# Patient Record
Sex: Female | Born: 1940 | Race: Black or African American | Hispanic: No | Marital: Married | Smoking: Never smoker
Health system: Southern US, Community
[De-identification: ages and names within clinical notes are randomized; demographics above are authoritative.]

## PROBLEM LIST (undated history)

## (undated) DIAGNOSIS — I10 Essential (primary) hypertension: Secondary | ICD-10-CM

## (undated) DIAGNOSIS — M1990 Unspecified osteoarthritis, unspecified site: Secondary | ICD-10-CM

## (undated) DIAGNOSIS — E119 Type 2 diabetes mellitus without complications: Secondary | ICD-10-CM

## (undated) HISTORY — PX: EYE SURGERY: SHX253

---

## 2016-11-23 DIAGNOSIS — I10 Essential (primary) hypertension: Secondary | ICD-10-CM | POA: Insufficient documentation

## 2016-11-23 DIAGNOSIS — E119 Type 2 diabetes mellitus without complications: Secondary | ICD-10-CM | POA: Insufficient documentation

## 2018-05-25 ENCOUNTER — Emergency Department (HOSPITAL_BASED_OUTPATIENT_CLINIC_OR_DEPARTMENT_OTHER)
Admission: EM | Admit: 2018-05-25 | Discharge: 2018-05-25 | Disposition: A | Discharge status: Home / Self Care | Payer: Medicare HMO | Attending: Emergency Medicine | Admitting: Emergency Medicine

## 2018-05-25 ENCOUNTER — Encounter (HOSPITAL_BASED_OUTPATIENT_CLINIC_OR_DEPARTMENT_OTHER): Payer: Self-pay | Admitting: Emergency Medicine

## 2018-05-25 ENCOUNTER — Other Ambulatory Visit: Payer: Self-pay

## 2018-05-25 DIAGNOSIS — I10 Essential (primary) hypertension: Secondary | ICD-10-CM | POA: Insufficient documentation

## 2018-05-25 DIAGNOSIS — R1031 Right lower quadrant pain: Secondary | ICD-10-CM | POA: Diagnosis present

## 2018-05-25 DIAGNOSIS — E119 Type 2 diabetes mellitus without complications: Secondary | ICD-10-CM | POA: Insufficient documentation

## 2018-05-25 DIAGNOSIS — Z79899 Other long term (current) drug therapy: Secondary | ICD-10-CM | POA: Insufficient documentation

## 2018-05-25 DIAGNOSIS — M5430 Sciatica, unspecified side: Secondary | ICD-10-CM

## 2018-05-25 HISTORY — DX: Essential (primary) hypertension: I10

## 2018-05-25 HISTORY — DX: Type 2 diabetes mellitus without complications: E11.9

## 2018-05-25 MED ORDER — MORPHINE SULFATE (PF) 4 MG/ML IV SOLN
4.0000 mg | Freq: Once | INTRAVENOUS | Status: AC
  Administered 2018-05-25: 4 mg via INTRAMUSCULAR
  Filled 2018-05-25: qty 1

## 2018-05-25 MED ORDER — PREDNISONE 10 MG (21) PO TBPK: ORAL_TABLET | ORAL | 0 refills | Status: DC

## 2018-05-25 MED ORDER — KETOROLAC TROMETHAMINE 30 MG/ML IJ SOLN
15.0000 mg | Freq: Once | INTRAMUSCULAR | Status: AC
  Administered 2018-05-25: 15 mg via INTRAVENOUS
  Filled 2018-05-25: qty 1

## 2018-05-25 MED ORDER — ONDANSETRON 4 MG PO TBDP
4.0000 mg | ORAL_TABLET | Freq: Once | ORAL | Status: AC
  Administered 2018-05-25: 4 mg via ORAL
  Filled 2018-05-25: qty 1

## 2018-05-25 MED ORDER — MORPHINE SULFATE (PF) 4 MG/ML IV SOLN: 4.0000 mg | Freq: Once | INTRAVENOUS | Status: DC

## 2018-05-25 NOTE — ED Triage Notes (Signed)
Patient states that she started to have pain to her right leg and hip with pain shooting down her right leg  - went to an urgent care yesterday and was treated  - called back today to them and they reported that the patient would have to follow up somewhere else

## 2018-05-25 NOTE — ED Provider Notes (Signed)
[facility] EMERGENCY DEPARTMENT Provider Note   CSN: [id number] Arrival date & time: 05/25/18  1431     History   Chief Complaint Chief Complaint  Patient presents with  . Back Pain    HPI Stacey Sloan is a 77 y.o. female.  Pt presents to the ED today with right sided back with pain shooting down right leg.  Sx started Thursday, 8/8.  The pt is normally very active and so she's not sure what she did.  She has never had anything like this in the past.  The pt said she went to urgent care yesterday and was given a shot of decadron.  She was also given a rx of meloxicam.  Urgent care is setting her up to see ortho and get an MRI.  Pain is worse today, so she came in here.     Past Medical History:  Diagnosis Date  . Diabetes mellitus without complication (HCC)   . Hypertension     There are no active problems to display for this patient.   History reviewed. No pertinent surgical history.   OB History   None      Home Medications    Prior to Admission medications   Medication Sig Start Date End Date Taking? Authorizing Provider  amLODipine (NORVASC) 10 MG tablet amlodipine 10 mg tablet    [provider]  glipiZIDE-metformin (METAGLIP) 5-500 MG tablet glipizide 5 mg-metformin 500 mg tablet    [provider]  lisinopril-hydrochlorothiazide (PRINZIDE,ZESTORETIC) 20-25 MG tablet lisinopril 20 mg-hydrochlorothiazide 25 mg tablet    [provider]  predniSONE (STERAPRED UNI-PAK 21 TAB) 10 MG (21) TBPK tablet Take 6 tabs by mouth daily  for 2 days, then 5 tabs for 2 days, then 4 tabs for 2 days, then 3 tabs for 2 days, 2 tabs for 2 days, then 1 tab by mouth daily for 2 days 05/25/18   Jacalyn LefevreHaviland, Breckon Reeves, MD    Family History History reviewed. No pertinent family history.  Social History Social History   Tobacco Use  . Smoking status: Never Smoker  . Smokeless tobacco: Never Used  Substance Use Topics  . Alcohol use: Never   Frequency: Never  . Drug use: Never     Allergies   Patient has no known allergies.   Review of Systems Review of Systems  Musculoskeletal:       Right leg pain  All other systems reviewed and are negative.    Physical Exam Updated Vital Signs BP 131/75 (BP Location: Right Arm)   Pulse 68   Temp 99 F (37.2 C) (Oral)   Resp 16   Ht 5\' 3"  (1.6 m)   Wt 56.7 kg   SpO2 100%   BMI 22.14 kg/m   Physical Exam  Constitutional: She is oriented to person, place, and time. She appears well-developed and well-nourished.  HENT:  Head: Normocephalic and atraumatic.  Right Ear: External ear normal.  Left Ear: External ear normal.  Nose: Nose normal.  Mouth/Throat: Oropharynx is clear and moist.  Eyes: Pupils are equal, round, and reactive to light. Conjunctivae and EOM are normal.  Neck: Normal range of motion. Neck supple.  Cardiovascular: Normal rate, regular rhythm, normal heart sounds and intact distal pulses.  Pulmonary/Chest: Effort normal and breath sounds normal.  Abdominal: Soft. Bowel sounds are normal.  Musculoskeletal: Normal range of motion.  Neurological: She is alert and oriented to person, place, and time.  Skin: Skin is warm. Capillary refill takes less than  2 seconds.  Psychiatric: She has a normal mood and affect. Her behavior is normal. Judgment and thought content normal.  Nursing note and vitals reviewed.    ED Treatments / Results  Labs (all labs ordered are listed, but only abnormal results are displayed) Labs Reviewed - No data to display  EKG None  Radiology No results found.  Procedures Procedures (including critical care time)  Medications Ordered in ED Medications  morphine 4 MG/ML injection 4 mg (has no administration in time range)  morphine 4 MG/ML injection 4 mg (4 mg Intramuscular Given 05/25/18 1521)  ondansetron (ZOFRAN-ODT) disintegrating tablet 4 mg (4 mg Oral Given 05/25/18 1522)  ketorolac (TORADOL) 30 MG/ML injection 15 mg  (15 mg Intravenous Given 05/25/18 1630)     Initial Impression / Assessment and Plan / ED Course  I have reviewed the triage vital signs and the nursing notes.  Pertinent labs & imaging results that were available during my care of the patient were reviewed by me and considered in my medical decision making (see chart for details).    Pt had a CD of lumbar spine films from urgent care.  She has severe ddd and mild scoliosis.  She has little disc space.  Pt has a cane at home, but I think she will need a walker. I wrote a rx for one.  She is able to ambulate, but requires assistance.  Family is close by and can help pt. She does have a rx for lortab which the urgent care gave her today.   Final Clinical Impressions(s) / ED Diagnoses   Final diagnoses:  Acute sciatica    ED Discharge Orders         Ordered    For home use only DME 4 wheeled rolling walker with seat     05/25/18 1704    predniSONE (STERAPRED UNI-PAK 21 TAB) 10 MG (21) TBPK tablet     05/25/18 1705           Jacalyn Lefevre, MD 05/25/18 [phone]

## 2018-05-25 NOTE — ED Notes (Signed)
Pt able to bear weight on leg, but grabbing onto staff and family for support while limping. Son states she usually walks unassisted and with no limp. EDP informed and now at bedside.

## 2018-05-27 ENCOUNTER — Ambulatory Visit (HOSPITAL_BASED_OUTPATIENT_CLINIC_OR_DEPARTMENT_OTHER)
Admission: RE | Admit: 2018-05-27 | Discharge: 2018-05-27 | Disposition: A | Discharge status: Home / Self Care | Payer: Medicare HMO | Source: Ambulatory Visit | Attending: Family Medicine | Admitting: Family Medicine

## 2018-05-27 ENCOUNTER — Ambulatory Visit (INDEPENDENT_AMBULATORY_CARE_PROVIDER_SITE_OTHER): Payer: Medicare HMO | Admitting: Family Medicine

## 2018-05-27 ENCOUNTER — Encounter: Payer: Self-pay | Admitting: Family Medicine

## 2018-05-27 VITALS — BP 161/78 | HR 93 | Ht 63.0 in | Wt 125.0 lb

## 2018-05-27 DIAGNOSIS — M1611 Unilateral primary osteoarthritis, right hip: Secondary | ICD-10-CM | POA: Insufficient documentation

## 2018-05-27 DIAGNOSIS — M79604 Pain in right leg: Secondary | ICD-10-CM | POA: Diagnosis not present

## 2018-05-27 DIAGNOSIS — M25551 Pain in right hip: Secondary | ICD-10-CM

## 2018-05-27 NOTE — Patient Instructions (Addendum)
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 Finish your prednisone dose pack. Don't take meloxicam while you're on this. Topical capsaicin, aspercreme or biofreeze up to 4 times a day topically. Ok to use salon pas patches with this also. Hydrocodone as needed for severe pain - cut pills in half to start with though. Use walker when up and walking around for support. Consider physical therapy - we can order this for you (there's one next door) - just let us know. Follow up in 1 week. If not improving we can consider trial of hip injection.

## 2018-05-27 NOTE — Progress Notes (Signed)
PCP: System, Pcp Not In  Subjective:   HPI: Patient is a 77 y.o. female here for right-sided posterior hip pain with radiation to the right foot.  Onset was 4 days ago.  Patient denies any known mechanism of injury.  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  She reports initial 10/10 pain is now 8/10 which is worse with walking wearing weight on that leg.  Relieved with rest.  3 days ago she was seen at the urgent care where lumbar x-rays were performed she was given an IM steroid injection.  The following day she went to the emergency department was treated with morphine, Toradol and discharged on a prednisone taper.  Her pain radiates down the posterior aspect of the right leg.  She denies any numbness or tingling associated with this.  No skin changes.  Past Medical History:  Diagnosis Date  . Diabetes mellitus without complication (HCC)   . Hypertension     Current Outpatient Medications on File Prior to Visit  Medication Sig Dispense Refill  . amLODipine (NORVASC) 10 MG tablet amlodipine 10 mg tablet    . glipiZIDE-metformin (METAGLIP) 5-500 MG tablet glipizide 5 mg-metformin 500 mg tablet    . lisinopril-hydrochlorothiazide (PRINZIDE,ZESTORETIC) 20-25 MG tablet lisinopril 20 mg-hydrochlorothiazide 25 mg tablet    . predniSONE (STERAPRED UNI-PAK 21 TAB) 10 MG (21) TBPK tablet Take 6 tabs by mouth daily  for 2 days, then 5 tabs for 2 days, then 4 tabs for 2 days, then 3 tabs for 2 days, 2 tabs for 2 days, then 1 tab by mouth daily for 2 days 42 tablet 0   No current facility-administered medications on file prior to visit.     History reviewed. No pertinent surgical history.  No Known Allergies  Social History   Socioeconomic History  . Marital status: Married    Spouse name: Not on file  . Number of children: Not on file  . Years of education: Not on file  . Highest education level: Not on file  Occupational History  . Not on file  Social Needs  . Financial  resource strain: Not on file  . Food insecurity:    Worry: Not on file    Inability: Not on file  . Transportation needs:    Medical: Not on file    Non-medical: Not on file  Tobacco Use  . Smoking status: Never Smoker  . Smokeless tobacco: Never Used  Substance and Sexual Activity  . Alcohol use: Never    Frequency: Never  . Drug use: Never  . Sexual activity: Not on file  Lifestyle  . Physical activity:    Days per week: Not on file    Minutes per session: Not on file  . Stress: Not on file  Relationships  . Social connections:    Talks on phone: Not on file    Gets together: Not on file    Attends religious service: Not on file    Active member of club or organization: Not on file    Attends meetings of clubs or organizations: Not on file    Relationship status: Not on file  . Intimate partner violence:    Fear of current or ex partner: Not on file    Emotionally abused: Not on file    Physically abused: Not on file    Forced sexual activity: Not on file  Other Topics Concern  . Not on file  Social History Narrative  . Not on file  History reviewed. No pertinent family history.  BP (!) 161/78   Pulse 93   Ht 5\' 3"  (1.6 m)   Wt 125 lb (56.7 kg)   BMI 22.14 kg/m   Review of Systems: See HPI above.     Objective:  Physical Exam:  Gen: awake, alert, NAD, comfortable in exam room Pulm: breathing unlabored  Lumbar spine: - Inspection: no gross deformity or asymmetry, swelling or ecchymosis - Palpation: No TTP over the spinous processes, paraspinal muscles - ROM: Limited range of motion flexion and extension - Strength: 4+/5 strength in knee flexion on the right.  Otherwise normal and symmetric strength of the lower extremities - Neuro: Normal sensation to light touch bilaterally - Special testing: Negative straight leg raise  Right hip:  - Inspection: No gross deformity - Palpation: No TTP - ROM: Decreased hip flexion but symmetric bilaterally.   Significant decrease in internal rotation, patient also has limited external rotation - Special Tests: Limited range of motion with FABER and FADIR.    Left hip:  - Inspection: No gross deformity - Palpation: No TTP - ROM: Decreased hip flexion as noted above.  Some decreased internal rotation but less severe than the right - Special Tests: Negative FABER and FADIR    Assessment & Plan:  1.  Right-sided sciatica: Patient has sciatica on the right with likely compression at the level of the hip.  X-rays obtained in urgent care were independently reviewed which show significant lumbar DDD which is most severe at L2-3 and L4-5.  Patient was sent for x-rays of the hip today which show no evidence of fracture but severe arthritis of the right hip, very mild of left hip.  Her exam still suggests sciatica as cause of her pain despite her hip arthritis. - Continue steroid Dosepak - Continue to walker as needed for ambulation - We will refer to physical therapy - Follow up in 1 week.

## 2018-05-28 ENCOUNTER — Encounter: Payer: Self-pay | Admitting: Family Medicine

## 2018-05-31 ENCOUNTER — Encounter: Payer: Self-pay | Admitting: Family Medicine

## 2018-05-31 ENCOUNTER — Ambulatory Visit: Payer: Medicare HMO | Admitting: Family Medicine

## 2018-05-31 VITALS — BP 160/81 | HR 85 | Ht 63.0 in | Wt 125.0 lb

## 2018-05-31 DIAGNOSIS — M25551 Pain in right hip: Secondary | ICD-10-CM | POA: Diagnosis not present

## 2018-05-31 DIAGNOSIS — M5441 Lumbago with sciatica, right side: Secondary | ICD-10-CM | POA: Diagnosis not present

## 2018-05-31 MED ORDER — METHYLPREDNISOLONE ACETATE 40 MG/ML IJ SUSP
40.0000 mg | Freq: Once | INTRAMUSCULAR | Status: AC
  Administered 2018-05-31: 40 mg via INTRA_ARTICULAR

## 2018-05-31 MED ORDER — DICLOFENAC SODIUM 1 % TD GEL: 4.0000 g | Freq: Four times a day (QID) | TRANSDERMAL | 1 refills | Status: DC

## 2018-05-31 MED ORDER — ONDANSETRON HCL 4 MG PO TABS: 4.0000 mg | ORAL_TABLET | Freq: Four times a day (QID) | ORAL | 0 refills | Status: AC | PRN

## 2018-05-31 NOTE — Patient Instructions (Signed)
You were given an intraarticular hip injection today. We will also set up an MRI of your lumbar spine.

## 2018-06-03 ENCOUNTER — Ambulatory Visit: Payer: Medicare HMO | Admitting: Family Medicine

## 2018-06-03 ENCOUNTER — Encounter: Payer: Self-pay | Admitting: Family Medicine

## 2018-06-03 NOTE — Progress Notes (Addendum)
PCP: System, Pcp Not In  Subjective:   HPI: 8/12: Patient is a 77 y.o. female here for right-sided posterior hip pain with radiation to the right foot.  Onset was 4 days ago.  Patient denies any known mechanism of injury.  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  She reports initial 10/10 pain is now 8/10 which is worse with walking wearing weight on that leg.  Relieved with rest.  3 days ago she was seen at the urgent care where lumbar x-rays were performed she was given an IM steroid injection.  The following day she went to the emergency department was treated with morphine, Toradol and discharged on a prednisone taper.  Her pain radiates down the posterior aspect of the right leg.  She denies any numbness or tingling associated with this.  No skin changes.  8/16: Patient returns today with severe pain in right buttock radiating down to her right foot with numbness. Pain level 7/10 and sharp, worse trying to bear weight. Prednisone did not seem to help - same for toradol, morphine, IM steroid. No skin changes.  Past Medical History:  Diagnosis Date  . Diabetes mellitus without complication (HCC)   . Hypertension     Current Outpatient Medications on File Prior to Visit  Medication Sig Dispense Refill  . amLODipine (NORVASC) 10 MG tablet amlodipine 10 mg tablet    . glipiZIDE-metformin (METAGLIP) 5-500 MG tablet glipizide 5 mg-metformin 500 mg tablet    . HYDROcodone-acetaminophen (NORCO/VICODIN) 5-325 MG tablet Take 1 tablet by mouth every 6 (six) hours as needed.  0  . lisinopril-hydrochlorothiazide (PRINZIDE,ZESTORETIC) 20-25 MG tablet lisinopril 20 mg-hydrochlorothiazide 25 mg tablet    . meloxicam (MOBIC) 15 MG tablet Take 15 mg by mouth daily as needed. for pain  0  . predniSONE (STERAPRED UNI-PAK 21 TAB) 10 MG (21) TBPK tablet Take 6 tabs by mouth daily  for 2 days, then 5 tabs for 2 days, then 4 tabs for 2 days, then 3 tabs for 2 days, 2 tabs for 2 days, then 1 tab by  mouth daily for 2 days 42 tablet 0   No current facility-administered medications on file prior to visit.     History reviewed. No pertinent surgical history.  No Known Allergies  Social History   Socioeconomic History  . Marital status: Married    Spouse name: Not on file  . Number of children: Not on file  . Years of education: Not on file  . Highest education level: Not on file  Occupational History  . Not on file  Social Needs  . Financial resource strain: Not on file  . Food insecurity:    Worry: Not on file    Inability: Not on file  . Transportation needs:    Medical: Not on file    Non-medical: Not on file  Tobacco Use  . Smoking status: Never Smoker  . Smokeless tobacco: Never Used  Substance and Sexual Activity  . Alcohol use: Never    Frequency: Never  . Drug use: Never  . Sexual activity: Not on file  Lifestyle  . Physical activity:    Days per week: Not on file    Minutes per session: Not on file  . Stress: Not on file  Relationships  . Social connections:    Talks on phone: Not on file    Gets together: Not on file    Attends religious service: Not on file    Active member of club  or organization: Not on file    Attends meetings of clubs or organizations: Not on file    Relationship status: Not on file  . Intimate partner violence:    Fear of current or ex partner: Not on file    Emotionally abused: Not on file    Physically abused: Not on file    Forced sexual activity: Not on file  Other Topics Concern  . Not on file  Social History Narrative  . Not on file    History reviewed. No pertinent family history.  BP (!) 160/81   Pulse 85   Ht 5\' 3"  (1.6 m)   Wt 125 lb (56.7 kg)   BMI 22.14 kg/m   Review of Systems: See HPI above.     Objective:  Physical Exam:  Gen: NAD, seated in wheelchair  Back: No gross deformity, scoliosis. No paraspinal TTP .  No midline or bony TTP. Limited flexion and extension due to pain. Strength LEs  4/5 right hip flexion and knee extension.  5/5 all other muscle groups.   Negative SLRs. Sensation intact to light touch bilaterally.  Right hip: No deformity. Minimal motion, severely limited with very mild pain. No tenderness to palpation. Negative logroll. Negative fabers.   Assessment & Plan:  1.  Right leg pain - pain currently out of proportion to exam.  Her radiographs of lumbar spine and hip negative for acute findings, malignancy but does have severe arthritis at L2-3 and L4-5 of back as well as right hip.  Not improving with prednisone dose pack, walker.  Trial of intraarticular hip injection today with ultrasound guidance.  Will also set up for MRI of lumbar spine - still suspect problem primarily radiculopathy.  After informed written consent timeout was performed, patient was lying supine on exam table.  Anterior right hip prepped lateral to vascular structures.  Then utilizing ultrasound guidance, patient's right hip was injected with 4:1 bupivicaine: depomedrol.  Patient tolerated procedure well without immediate complications.  Addendum:  Patient requested medicine for claustrophobia - will send low dose valium in to her pharmacy after review of database.  Addendum:  MRI reviewed and discussed with patient's son.  She unfortunately has severe degenerative changes in her back.  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  She will start gabapentin, will set her up for ESI at L5-S1 with fluoroscopic guidance ideally somewhere close to home, and refer to neurosurgery for consultation

## 2018-06-05 MED ORDER — DIAZEPAM 2 MG PO TABS: ORAL_TABLET | ORAL | 0 refills | Status: AC

## 2018-06-05 NOTE — Addendum Note (Signed)
Addended by: Lenda KelpHUDNALL, SHANE R on: 06/05/2018 01:07 PM   Modules accepted: Orders

## 2018-06-08 ENCOUNTER — Ambulatory Visit (HOSPITAL_BASED_OUTPATIENT_CLINIC_OR_DEPARTMENT_OTHER): Payer: Medicare HMO

## 2018-06-11 ENCOUNTER — Telehealth: Payer: Self-pay | Admitting: Family Medicine

## 2018-06-11 NOTE — Telephone Encounter (Signed)
Her insurance initially denied her MRI - I have a peer to peer with them tomorrow to try to get it approved.

## 2018-06-11 NOTE — Telephone Encounter (Signed)
Patient's son called wanting to know if an appointment needs to be scheduled, or what needs to be done because it has been 2 weeks since last injection and patient not getting better.

## 2018-06-12 NOTE — Addendum Note (Signed)
Addended by: Kathi SimpersWISE, Garret Teale F on: 06/12/2018 02:47 PM   Modules accepted: Orders

## 2018-06-13 ENCOUNTER — Ambulatory Visit: Payer: Medicare HMO | Admitting: Family Medicine

## 2018-06-14 ENCOUNTER — Ambulatory Visit
Admission: RE | Admit: 2018-06-14 | Discharge: 2018-06-14 | Disposition: A | Discharge status: Home / Self Care | Payer: Medicare HMO | Source: Ambulatory Visit | Attending: Family Medicine | Admitting: Family Medicine

## 2018-06-14 DIAGNOSIS — M5441 Lumbago with sciatica, right side: Secondary | ICD-10-CM

## 2018-06-18 MED ORDER — GABAPENTIN 300 MG PO CAPS: 300.0000 mg | ORAL_CAPSULE | Freq: Every day | ORAL | 2 refills | Status: AC

## 2018-06-18 MED ORDER — GABAPENTIN 300 MG PO CAPS: 300.0000 mg | ORAL_CAPSULE | Freq: Every day | ORAL | 2 refills | Status: DC

## 2018-06-18 NOTE — Addendum Note (Signed)
Addended by: Kathi Simpers F on: 06/18/2018 01:56 PM   Modules accepted: Orders

## 2018-06-18 NOTE — Addendum Note (Signed)
Addended by: Lenda Kelp on: 06/18/2018 01:30 PM   Modules accepted: Orders

## 2018-06-25 ENCOUNTER — Other Ambulatory Visit: Payer: Self-pay | Admitting: Family Medicine

## 2018-06-25 DIAGNOSIS — M5416 Radiculopathy, lumbar region: Secondary | ICD-10-CM

## 2018-06-27 NOTE — Telephone Encounter (Signed)
Insurance approved MRI  PA number is [id number]

## 2018-07-01 ENCOUNTER — Ambulatory Visit
Admission: RE | Admit: 2018-07-01 | Discharge: 2018-07-01 | Disposition: A | Discharge status: Home / Self Care | Payer: Medicare HMO | Source: Ambulatory Visit | Attending: Family Medicine | Admitting: Family Medicine

## 2018-07-01 ENCOUNTER — Other Ambulatory Visit: Payer: Self-pay | Admitting: Family Medicine

## 2018-07-01 DIAGNOSIS — M5416 Radiculopathy, lumbar region: Secondary | ICD-10-CM

## 2018-07-01 MED ORDER — METHYLPREDNISOLONE ACETATE 40 MG/ML INJ SUSP (RADIOLOG
120.0000 mg | Freq: Once | INTRAMUSCULAR | Status: AC
  Administered 2018-07-01: 120 mg via EPIDURAL

## 2018-07-01 MED ORDER — IOPAMIDOL (ISOVUE-M 200) INJECTION 41%
1.0000 mL | Freq: Once | INTRAMUSCULAR | Status: AC
  Administered 2018-07-01: 1 mL via EPIDURAL

## 2018-07-01 NOTE — Discharge Instructions (Signed)

## 2018-07-04 ENCOUNTER — Other Ambulatory Visit: Payer: Self-pay | Admitting: Neurosurgery

## 2018-07-05 ENCOUNTER — Encounter (HOSPITAL_COMMUNITY): Payer: Self-pay | Admitting: *Deleted

## 2018-07-05 ENCOUNTER — Other Ambulatory Visit: Payer: Self-pay

## 2018-07-05 NOTE — Progress Notes (Signed)
I spoke with Rulon SeraMervin Bennison , Mrs Gilcrease's son.  Mrs Manson PasseyBrown has us permission to speak to him- her preference.  I gave instructions to stop Vitamin today,  Sunday evening and Monday am to Hold Glipizide- Metformin. I instructed that blood pressure medication be held the morning of surgery.  Mr Manson PasseyBrown reports that patient's CBG's run in the 120 range, he reports that last A1C was in the 7 range, not sure when it was drawn. Mr Manson PasseyBrown reports that patient has not had an EKG in the past year. I will request records from [place].  I instructed patient to check CBG after awaking and every 2 hours until arrival  to the hospital.  I Instructed patient if CBG is less than 70 to take 4 Glucose Tablets or 1 tube of Glucose Gel or 1/2 cup of a clear juice. Recheck CBG in 15 minutes then call pre- op desk at [phone] for further instructions.

## 2018-07-08 ENCOUNTER — Ambulatory Visit (HOSPITAL_COMMUNITY): Payer: Medicare HMO

## 2018-07-08 ENCOUNTER — Other Ambulatory Visit: Payer: Medicare HMO

## 2018-07-08 ENCOUNTER — Encounter (HOSPITAL_COMMUNITY)
Admission: RE | Disposition: A | Discharge status: Home / Self Care | Payer: Self-pay | Source: Ambulatory Visit | Attending: Neurosurgery

## 2018-07-08 ENCOUNTER — Ambulatory Visit (HOSPITAL_COMMUNITY): Payer: Medicare HMO | Admitting: Certified Registered Nurse Anesthetist

## 2018-07-08 ENCOUNTER — Encounter (HOSPITAL_COMMUNITY): Payer: Self-pay | Admitting: *Deleted

## 2018-07-08 ENCOUNTER — Other Ambulatory Visit: Payer: Self-pay

## 2018-07-08 ENCOUNTER — Ambulatory Visit (HOSPITAL_COMMUNITY)
Admission: RE | Admit: 2018-07-08 | Discharge: 2018-07-09 | Disposition: A | Discharge status: Home / Self Care | Payer: Medicare HMO | Source: Ambulatory Visit | Attending: Neurosurgery | Admitting: Neurosurgery

## 2018-07-08 DIAGNOSIS — M79604 Pain in right leg: Secondary | ICD-10-CM | POA: Diagnosis present

## 2018-07-08 DIAGNOSIS — M21371 Foot drop, right foot: Secondary | ICD-10-CM | POA: Diagnosis not present

## 2018-07-08 DIAGNOSIS — I10 Essential (primary) hypertension: Secondary | ICD-10-CM | POA: Diagnosis not present

## 2018-07-08 DIAGNOSIS — M5116 Intervertebral disc disorders with radiculopathy, lumbar region: Secondary | ICD-10-CM | POA: Diagnosis not present

## 2018-07-08 DIAGNOSIS — Z79899 Other long term (current) drug therapy: Secondary | ICD-10-CM | POA: Insufficient documentation

## 2018-07-08 DIAGNOSIS — E119 Type 2 diabetes mellitus without complications: Secondary | ICD-10-CM | POA: Insufficient documentation

## 2018-07-08 DIAGNOSIS — Z419 Encounter for procedure for purposes other than remedying health state, unspecified: Secondary | ICD-10-CM

## 2018-07-08 DIAGNOSIS — M48061 Spinal stenosis, lumbar region without neurogenic claudication: Secondary | ICD-10-CM | POA: Diagnosis not present

## 2018-07-08 DIAGNOSIS — M47816 Spondylosis without myelopathy or radiculopathy, lumbar region: Secondary | ICD-10-CM | POA: Diagnosis not present

## 2018-07-08 HISTORY — DX: Unspecified osteoarthritis, unspecified site: M19.90

## 2018-07-08 HISTORY — PX: LUMBAR LAMINECTOMY/DECOMPRESSION MICRODISCECTOMY: SHX5026

## 2018-07-08 LAB — BASIC METABOLIC PANEL
Anion gap: 17 — ABNORMAL HIGH (ref 5–15)
BUN: 17 mg/dL (ref 8–23)
CHLORIDE: 91 mmol/L — AB (ref 98–111)
CO2: 26 mmol/L (ref 22–32)
CREATININE: 0.79 mg/dL (ref 0.44–1.00)
Calcium: 9.7 mg/dL (ref 8.9–10.3)
GFR calc non Af Amer: >60 mL/min (ref >60)
Glucose, Bld: 186 mg/dL — ABNORMAL HIGH (ref 70–99)
POTASSIUM: 3.4 mmol/L — AB (ref 3.5–5.1)
Sodium: 134 mmol/L — ABNORMAL LOW (ref 135–145)

## 2018-07-08 LAB — GLUCOSE, CAPILLARY
GLUCOSE-CAPILLARY: 190 mg/dL — AB (ref 70–99)
GLUCOSE-CAPILLARY: 215 mg/dL — AB (ref 70–99)
GLUCOSE-CAPILLARY: 271 mg/dL — AB (ref 70–99)
Glucose-Capillary: 170 mg/dL — ABNORMAL HIGH (ref 70–99)

## 2018-07-08 LAB — HEMOGLOBIN: HEMOGLOBIN: 13.1 g/dL (ref 12.0–15.0)

## 2018-07-08 LAB — HEMOGLOBIN A1C
Hgb A1c MFr Bld: 7 % — ABNORMAL HIGH (ref 4.8–5.6)
Mean Plasma Glucose: 154.2 mg/dL

## 2018-07-08 SURGERY — LUMBAR LAMINECTOMY/DECOMPRESSION MICRODISCECTOMY 1 LEVEL
Anesthesia: General | Site: Back | Laterality: Right

## 2018-07-08 MED ORDER — HEMOSTATIC AGENTS (NO CHARGE) OPTIME
TOPICAL | Status: DC | PRN
  Administered 2018-07-08: 1

## 2018-07-08 MED ORDER — MENTHOL 3 MG MT LOZG: 1.0000 | LOZENGE | OROMUCOSAL | Status: DC | PRN

## 2018-07-08 MED ORDER — THROMBIN 5000 UNITS EX SOLR
CUTANEOUS | Status: AC
  Filled 2018-07-08: qty 10000

## 2018-07-08 MED ORDER — LISINOPRIL 20 MG PO TABS
20.0000 mg | ORAL_TABLET | Freq: Every day | ORAL | Status: DC
  Administered 2018-07-08: 20 mg via ORAL
  Filled 2018-07-08: qty 1

## 2018-07-08 MED ORDER — FENTANYL CITRATE (PF) 250 MCG/5ML IJ SOLN
INTRAMUSCULAR | Status: AC
  Filled 2018-07-08: qty 5

## 2018-07-08 MED ORDER — LIDOCAINE 2% (20 MG/ML) 5 ML SYRINGE
INTRAMUSCULAR | Status: DC | PRN
  Administered 2018-07-08: 100 mg via INTRAVENOUS

## 2018-07-08 MED ORDER — PANTOPRAZOLE SODIUM 40 MG PO TBEC
40.0000 mg | DELAYED_RELEASE_TABLET | Freq: Every day | ORAL | Status: DC
  Administered 2018-07-08: 40 mg via ORAL
  Filled 2018-07-08: qty 1

## 2018-07-08 MED ORDER — ONDANSETRON HCL 4 MG PO TABS: 4.0000 mg | ORAL_TABLET | Freq: Four times a day (QID) | ORAL | Status: DC | PRN

## 2018-07-08 MED ORDER — SODIUM CHLORIDE 0.9 % IV SOLN
INTRAVENOUS | Status: DC | PRN
  Administered 2018-07-08: 500 mL

## 2018-07-08 MED ORDER — HYDROCHLOROTHIAZIDE 25 MG PO TABS
25.0000 mg | ORAL_TABLET | Freq: Every day | ORAL | Status: DC
  Administered 2018-07-08: 25 mg via ORAL
  Filled 2018-07-08: qty 1

## 2018-07-08 MED ORDER — ONDANSETRON HCL 4 MG/2ML IJ SOLN: 4.0000 mg | Freq: Four times a day (QID) | INTRAMUSCULAR | Status: DC | PRN

## 2018-07-08 MED ORDER — LIDOCAINE-EPINEPHRINE 1 %-1:100000 IJ SOLN
INTRAMUSCULAR | Status: AC
  Filled 2018-07-08: qty 1

## 2018-07-08 MED ORDER — CHLORHEXIDINE GLUCONATE CLOTH 2 % EX PADS: 6.0000 | MEDICATED_PAD | Freq: Once | CUTANEOUS | Status: DC

## 2018-07-08 MED ORDER — LISINOPRIL-HYDROCHLOROTHIAZIDE 20-25 MG PO TABS: 1.0000 | ORAL_TABLET | Freq: Every day | ORAL | Status: DC

## 2018-07-08 MED ORDER — GLIPIZIDE 5 MG PO TABS
5.0000 mg | ORAL_TABLET | Freq: Two times a day (BID) | ORAL | Status: DC
  Administered 2018-07-09: 5 mg via ORAL
  Filled 2018-07-08 (×3): qty 1

## 2018-07-08 MED ORDER — FENTANYL CITRATE (PF) 250 MCG/5ML IJ SOLN
INTRAMUSCULAR | Status: DC | PRN
  Administered 2018-07-08: 50 ug via INTRAVENOUS

## 2018-07-08 MED ORDER — CEFAZOLIN SODIUM-DEXTROSE 2-4 GM/100ML-% IV SOLN
2.0000 g | INTRAVENOUS | Status: AC
  Administered 2018-07-08: 2 g via INTRAVENOUS

## 2018-07-08 MED ORDER — SUGAMMADEX SODIUM 200 MG/2ML IV SOLN
INTRAVENOUS | Status: DC | PRN
  Administered 2018-07-08: 200 mg via INTRAVENOUS

## 2018-07-08 MED ORDER — ACETAMINOPHEN 325 MG PO TABS: 650.0000 mg | ORAL_TABLET | ORAL | Status: DC | PRN

## 2018-07-08 MED ORDER — HYDROCODONE-ACETAMINOPHEN 5-325 MG PO TABS: 1.0000 | ORAL_TABLET | ORAL | Status: DC | PRN

## 2018-07-08 MED ORDER — BUPIVACAINE HCL (PF) 0.25 % IJ SOLN
INTRAMUSCULAR | Status: DC | PRN
  Administered 2018-07-08: 10 mL

## 2018-07-08 MED ORDER — SODIUM CHLORIDE 0.9% FLUSH
3.0000 mL | Freq: Two times a day (BID) | INTRAVENOUS | Status: DC
  Administered 2018-07-08: 3 mL via INTRAVENOUS

## 2018-07-08 MED ORDER — PROPOFOL 10 MG/ML IV BOLUS
INTRAVENOUS | Status: AC
  Filled 2018-07-08: qty 40

## 2018-07-08 MED ORDER — DEXAMETHASONE SODIUM PHOSPHATE 10 MG/ML IJ SOLN
INTRAMUSCULAR | Status: AC
  Filled 2018-07-08: qty 1

## 2018-07-08 MED ORDER — ONDANSETRON HCL 4 MG/2ML IJ SOLN
INTRAMUSCULAR | Status: DC | PRN
  Administered 2018-07-08: 4 mg via INTRAVENOUS

## 2018-07-08 MED ORDER — GLIPIZIDE-METFORMIN HCL 5-500 MG PO TABS: 1.0000 | ORAL_TABLET | Freq: Two times a day (BID) | ORAL | Status: DC

## 2018-07-08 MED ORDER — PHENYLEPHRINE 40 MCG/ML (10ML) SYRINGE FOR IV PUSH (FOR BLOOD PRESSURE SUPPORT)
PREFILLED_SYRINGE | INTRAVENOUS | Status: DC | PRN
  Administered 2018-07-08 (×3): 40 ug via INTRAVENOUS

## 2018-07-08 MED ORDER — HYDROCODONE-ACETAMINOPHEN 5-325 MG PO TABS
1.0000 | ORAL_TABLET | ORAL | Status: DC | PRN
  Administered 2018-07-08 – 2018-07-09 (×3): 1 via ORAL
  Filled 2018-07-08 (×3): qty 1

## 2018-07-08 MED ORDER — LIDOCAINE-EPINEPHRINE 1 %-1:100000 IJ SOLN
INTRAMUSCULAR | Status: DC | PRN
  Administered 2018-07-08: 8 mL

## 2018-07-08 MED ORDER — HYDROMORPHONE HCL 1 MG/ML IJ SOLN: 0.5000 mg | INTRAMUSCULAR | Status: DC | PRN

## 2018-07-08 MED ORDER — METFORMIN HCL 500 MG PO TABS
500.0000 mg | ORAL_TABLET | Freq: Two times a day (BID) | ORAL | Status: DC
  Administered 2018-07-08 – 2018-07-09 (×2): 500 mg via ORAL
  Filled 2018-07-08 (×2): qty 1

## 2018-07-08 MED ORDER — LACTATED RINGERS IV SOLN
INTRAVENOUS | Status: DC
  Administered 2018-07-08: 12:00:00 via INTRAVENOUS

## 2018-07-08 MED ORDER — PHENOL 1.4 % MT LIQD: 1.0000 | OROMUCOSAL | Status: DC | PRN

## 2018-07-08 MED ORDER — BUPIVACAINE HCL (PF) 0.25 % IJ SOLN
INTRAMUSCULAR | Status: AC
  Filled 2018-07-08: qty 30

## 2018-07-08 MED ORDER — GABAPENTIN 300 MG PO CAPS
300.0000 mg | ORAL_CAPSULE | Freq: Every day | ORAL | Status: DC
  Administered 2018-07-08: 300 mg via ORAL
  Filled 2018-07-08: qty 1

## 2018-07-08 MED ORDER — SODIUM CHLORIDE 0.9% FLUSH: 3.0000 mL | INTRAVENOUS | Status: DC | PRN

## 2018-07-08 MED ORDER — ONDANSETRON HCL 4 MG/2ML IJ SOLN
INTRAMUSCULAR | Status: AC
  Filled 2018-07-08: qty 2

## 2018-07-08 MED ORDER — ADULT MULTIVITAMIN W/MINERALS CH
1.0000 | ORAL_TABLET | Freq: Every day | ORAL | Status: DC
  Administered 2018-07-08: 1 via ORAL
  Filled 2018-07-08: qty 1

## 2018-07-08 MED ORDER — DEXAMETHASONE SODIUM PHOSPHATE 10 MG/ML IJ SOLN
10.0000 mg | INTRAMUSCULAR | Status: AC
  Administered 2018-07-08: 10 mg via INTRAVENOUS

## 2018-07-08 MED ORDER — SODIUM CHLORIDE 0.9 % IV SOLN: 250.0000 mL | INTRAVENOUS | Status: DC

## 2018-07-08 MED ORDER — PROPOFOL 10 MG/ML IV BOLUS
INTRAVENOUS | Status: DC | PRN
  Administered 2018-07-08: 70 mg via INTRAVENOUS

## 2018-07-08 MED ORDER — ACETAMINOPHEN 650 MG RE SUPP: 650.0000 mg | RECTAL | Status: DC | PRN

## 2018-07-08 MED ORDER — AMLODIPINE BESYLATE 5 MG PO TABS: 10.0000 mg | ORAL_TABLET | Freq: Every day | ORAL | Status: DC

## 2018-07-08 MED ORDER — 0.9 % SODIUM CHLORIDE (POUR BTL) OPTIME
TOPICAL | Status: DC | PRN
  Administered 2018-07-08: 1000 mL

## 2018-07-08 MED ORDER — CEFAZOLIN SODIUM-DEXTROSE 2-4 GM/100ML-% IV SOLN
INTRAVENOUS | Status: AC
  Filled 2018-07-08: qty 100

## 2018-07-08 MED ORDER — THROMBIN 5000 UNITS EX SOLR
CUTANEOUS | Status: DC | PRN
  Administered 2018-07-08 (×2): 5000 [IU] via TOPICAL

## 2018-07-08 MED ORDER — ROCURONIUM BROMIDE 10 MG/ML (PF) SYRINGE
PREFILLED_SYRINGE | INTRAVENOUS | Status: DC | PRN
  Administered 2018-07-08: 10 mg via INTRAVENOUS
  Administered 2018-07-08: 20 mg via INTRAVENOUS
  Administered 2018-07-08: 10 mg via INTRAVENOUS
  Administered 2018-07-08: 30 mg via INTRAVENOUS

## 2018-07-08 MED ORDER — CYCLOBENZAPRINE HCL 10 MG PO TABS: 10.0000 mg | ORAL_TABLET | Freq: Three times a day (TID) | ORAL | Status: DC | PRN

## 2018-07-08 MED ORDER — CEFAZOLIN SODIUM-DEXTROSE 2-4 GM/100ML-% IV SOLN
2.0000 g | Freq: Three times a day (TID) | INTRAVENOUS | Status: AC
  Administered 2018-07-08 – 2018-07-09 (×2): 2 g via INTRAVENOUS
  Filled 2018-07-08 (×2): qty 100

## 2018-07-08 MED ORDER — ALUM & MAG HYDROXIDE-SIMETH 200-200-20 MG/5ML PO SUSP: 30.0000 mL | Freq: Four times a day (QID) | ORAL | Status: DC | PRN

## 2018-07-08 SURGICAL SUPPLY — 59 items
BAG DECANTER FOR FLEXI CONT (MISCELLANEOUS) ×3 IMPLANT
BENZOIN TINCTURE PRP APPL 2/3 (GAUZE/BANDAGES/DRESSINGS) ×3 IMPLANT
BLADE CLIPPER SURG (BLADE) IMPLANT
BLADE SURG 11 STRL SS (BLADE) ×3 IMPLANT
BUR CUTTER 7.0 ROUND (BURR) ×3 IMPLANT
BUR MATCHSTICK NEURO 3.0 LAGG (BURR) ×3 IMPLANT
CANISTER SUCT 3000ML PPV (MISCELLANEOUS) ×3 IMPLANT
CARTRIDGE OIL MAESTRO DRILL (MISCELLANEOUS) ×1 IMPLANT
CLOSURE STERI-STRIP 1/2X4 (GAUZE/BANDAGES/DRESSINGS) ×1
CLOSURE WOUND 1/2 X4 (GAUZE/BANDAGES/DRESSINGS) ×1
CLSR STERI-STRIP ANTIMIC 1/2X4 (GAUZE/BANDAGES/DRESSINGS) ×2 IMPLANT
DECANTER SPIKE VIAL GLASS SM (MISCELLANEOUS) ×3 IMPLANT
DERMABOND ADVANCED (GAUZE/BANDAGES/DRESSINGS) ×2
DERMABOND ADVANCED .7 DNX12 (GAUZE/BANDAGES/DRESSINGS) ×1 IMPLANT
DIFFUSER DRILL AIR PNEUMATIC (MISCELLANEOUS) ×3 IMPLANT
DRAPE HALF SHEET 40X57 (DRAPES) IMPLANT
DRAPE LAPAROTOMY 100X72X124 (DRAPES) ×3 IMPLANT
DRAPE MICROSCOPE LEICA (MISCELLANEOUS) ×3 IMPLANT
DRAPE SURG 17X23 STRL (DRAPES) ×3 IMPLANT
DRSG OPSITE POSTOP 4X6 (GAUZE/BANDAGES/DRESSINGS) ×3 IMPLANT
DURAPREP 26ML APPLICATOR (WOUND CARE) ×3 IMPLANT
ELECT REM PT RETURN 9FT ADLT (ELECTROSURGICAL) ×3
ELECTRODE REM PT RTRN 9FT ADLT (ELECTROSURGICAL) ×1 IMPLANT
GAUZE 4X4 16PLY RFD (DISPOSABLE) IMPLANT
GAUZE SPONGE 4X4 12PLY STRL (GAUZE/BANDAGES/DRESSINGS) IMPLANT
GLOVE BIO SURGEON STRL SZ7 (GLOVE) IMPLANT
GLOVE BIO SURGEON STRL SZ8 (GLOVE) ×3 IMPLANT
GLOVE BIOGEL PI IND STRL 6.5 (GLOVE) ×2 IMPLANT
GLOVE BIOGEL PI IND STRL 7.0 (GLOVE) ×1 IMPLANT
GLOVE BIOGEL PI INDICATOR 6.5 (GLOVE) ×4
GLOVE BIOGEL PI INDICATOR 7.0 (GLOVE) ×2
GLOVE EXAM NITRILE LRG STRL (GLOVE) IMPLANT
GLOVE EXAM NITRILE XL STR (GLOVE) IMPLANT
GLOVE EXAM NITRILE XS STR PU (GLOVE) IMPLANT
GLOVE INDICATOR 8.5 STRL (GLOVE) ×3 IMPLANT
GLOVE SURG SS PI 6.5 STRL IVOR (GLOVE) ×12 IMPLANT
GOWN STRL REUS W/ TWL LRG LVL3 (GOWN DISPOSABLE) ×1 IMPLANT
GOWN STRL REUS W/ TWL XL LVL3 (GOWN DISPOSABLE) ×1 IMPLANT
GOWN STRL REUS W/TWL 2XL LVL3 (GOWN DISPOSABLE) IMPLANT
GOWN STRL REUS W/TWL LRG LVL3 (GOWN DISPOSABLE) ×2
GOWN STRL REUS W/TWL XL LVL3 (GOWN DISPOSABLE) ×2
KIT BASIN OR (CUSTOM PROCEDURE TRAY) ×3 IMPLANT
KIT TURNOVER KIT B (KITS) ×3 IMPLANT
NEEDLE HYPO 22GX1.5 SAFETY (NEEDLE) ×3 IMPLANT
NEEDLE SPNL 22GX3.5 QUINCKE BK (NEEDLE) ×3 IMPLANT
NS IRRIG 1000ML POUR BTL (IV SOLUTION) ×3 IMPLANT
OIL CARTRIDGE MAESTRO DRILL (MISCELLANEOUS) ×3
PACK LAMINECTOMY NEURO (CUSTOM PROCEDURE TRAY) ×3 IMPLANT
RUBBERBAND STERILE (MISCELLANEOUS) ×6 IMPLANT
SPONGE SURGIFOAM ABS GEL SZ50 (HEMOSTASIS) ×3 IMPLANT
STRIP CLOSURE SKIN 1/2X4 (GAUZE/BANDAGES/DRESSINGS) ×2 IMPLANT
SUT BONE WAX W31G (SUTURE) ×3 IMPLANT
SUT VIC AB 0 CT1 18XCR BRD8 (SUTURE) ×1 IMPLANT
SUT VIC AB 0 CT1 8-18 (SUTURE) ×2
SUT VIC AB 2-0 CT1 18 (SUTURE) ×3 IMPLANT
SUT VICRYL 4-0 PS2 18IN ABS (SUTURE) ×3 IMPLANT
TOWEL GREEN STERILE (TOWEL DISPOSABLE) ×3 IMPLANT
TOWEL GREEN STERILE FF (TOWEL DISPOSABLE) ×3 IMPLANT
WATER STERILE IRR 1000ML POUR (IV SOLUTION) ×3 IMPLANT

## 2018-07-08 NOTE — Plan of Care (Signed)
  Problem: Safety: Goal: Ability to remain free from injury will improve Outcome: Progressing   

## 2018-07-08 NOTE — Transfer of Care (Signed)
Immediate Anesthesia Transfer of Care Note  Patient: Stacey Sloan  Procedure(s) Performed: Laminectomy and Foraminotomy - Lumbar four - Lumbar five - right intra- and extraforaminal (Right Back)  Patient Location: PACU  Anesthesia Type:General  Level of Consciousness: awake, alert , patient cooperative and responds to stimulation  Airway & Oxygen Therapy: Patient Spontanous Breathing and Patient connected to nasal cannula oxygen  Post-op Assessment: Report given to RN, Post -op Vital signs reviewed and stable and Patient moving all extremities  Post vital signs: Reviewed and stable  Last Vitals:  Vitals Value Taken Time  BP 98/70 07/08/2018  3:29 PM  Temp    Pulse 77 07/08/2018  3:32 PM  Resp 15 07/08/2018  3:32 PM  SpO2 96 % 07/08/2018  3:32 PM  Vitals shown include unvalidated device data.  Last Pain:  Vitals:   07/08/18 1221  TempSrc:   PainSc: 5       Patients Stated Pain Goal: 3 (07/08/18 1221)  Complications: No apparent anesthesia complications

## 2018-07-08 NOTE — Anesthesia Procedure Notes (Signed)
Procedure Name: Intubation Date/Time: 07/08/2018 1:32 PM Performed by: Waynard EdwardsSmith, Aaniyah Strohm A, CRNA Pre-anesthesia Checklist: Patient identified, Emergency Drugs available, Patient being monitored and Suction available Patient Re-evaluated:Patient Re-evaluated prior to induction Oxygen Delivery Method: Circle System Utilized Preoxygenation: Pre-oxygenation with 100% oxygen Induction Type: IV induction Ventilation: Mask ventilation without difficulty and Oral airway inserted - appropriate to patient size Laryngoscope Size: Hyacinth MeekerMiller and 2 Grade View: Grade I Tube type: Oral Tube size: 7.0 mm Number of attempts: 1 Airway Equipment and Method: Stylet and Oral airway Placement Confirmation: ETT inserted through vocal cords under direct vision,  positive ETCO2 and breath sounds checked- equal and bilateral Secured at: 23 cm Tube secured with: Tape Dental Injury: Teeth and Oropharynx as per pre-operative assessment  Comments: Performed by Drue StagerSRNA Mandy Engel

## 2018-07-08 NOTE — Anesthesia Preprocedure Evaluation (Signed)
Anesthesia Evaluation  Patient identified by MRN, date of birth, ID band Patient awake    Reviewed: Allergy & Precautions, NPO status , Patient's Chart, lab work & pertinent test results  Airway Mallampati: II  TM Distance: >3 FB Neck ROM: Full    Dental no notable dental hx. (+) Upper Dentures, Partial Lower   Pulmonary neg pulmonary ROS,    Pulmonary exam normal breath sounds clear to auscultation       Cardiovascular hypertension, Normal cardiovascular exam Rhythm:Regular Rate:Normal     Neuro/Psych negative neurological ROS  negative psych ROS   GI/Hepatic negative GI ROS,   Endo/Other  diabetes, Well Controlled, Type 2  Renal/GU      Musculoskeletal  (+) Arthritis ,   Abdominal   Peds  Hematology negative hematology ROS (+)   Anesthesia Other Findings   Reproductive/Obstetrics                             Anesthesia Physical Anesthesia Plan  ASA: III  Anesthesia Plan: General   Post-op Pain Management:    Induction: Intravenous  PONV Risk Score and Plan: 3 and Treatment may vary due to age or medical condition, Ondansetron and Dexamethasone  Airway Management Planned: Oral ETT  Additional Equipment:   Intra-op Plan:   Post-operative Plan: Extubation in OR  Informed Consent: I have reviewed the patients History and Physical, chart, labs and discussed the procedure including the risks, benefits and alternatives for the proposed anesthesia with the patient or authorized representative who has indicated his/her understanding and acceptance.   Dental advisory given  Plan Discussed with: CRNA  Anesthesia Plan Comments:         Anesthesia Quick Evaluation

## 2018-07-08 NOTE — Op Note (Signed)
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  Extraforaminal decompression of the right L4 nerve root with microdissection of the right L4 nerve root and microscopic discectomy  Surgeon: Stacey Sloan  Anesthesia: General  EBL: Minimal  HPI: 77 year old female presented with foot drop severe spinal stenosis L4-5 failed all forms of conservative anti-inflammatories physical therapy and epidural steroid injections.  Due to her progressive clinical syndrome imaging findings and failed conservative treatment I recommended decompressive laminectomy with both intra-and extraforaminal decompression.  I extensively went over the risks and benefits of the procedure with her as well as perioperative course expectations of outcome and alternatives of surgery and she understood and agreed to proceed forward.  Operative procedure: Patient brought into the O2 sat general anesthesia positioned prone on the Wilson frame her back was prepped and draped in routine sterile fashion preoperative x-ray localized the appropriate level so after infiltration of 10 cc lidocaine with epi midline incision was made and Bovie the car was used to take down subcu tissue and subperiosteal dissection was carried lamina of L4 and L5 on the right.  Intraoperative x-ray confirmed identification appropriate level so the inferior aspect the lamina L4 medial facet complex superior aspect of the lamina L5 was drilled and with a high-speed drill as well as the extra foraminal space the lateral aspect of the pars inferior aspect of 3 4 facet and superior aspect of the 4 5 facet.  Laminotomy was then begun on in the inferior aspect of the lamina of L4 and ligament flavum was identified noting markedly hypertrophied this was  all removed in piecemeal fashion divided the medial gutter allowed defecation the L5 pedicle sublaminar decompression decompress the central canal over towards the patient's left side.  Then under microscopic illumination, the extra foraminal decompression was begun there was marked spondylosis large spurs growing out the superior aspect 4 5 facet as well as the inferior aspect of 3 4 facet these were all drilled down and remove the intertransverse ligaments identified there was a large superior spur, and L4-5 that was causing severe compression onto the L4 nerve root this was all teased off L4 nerve root identifying a IV nerve root and opening up the L4 foramen.  The disc space was also bulging at this level so in size of the space remove several large frames of this and cleaned it out from the extra foraminal compartment.  I then reposition the microscope to look in the canal and abutting the medial gutter removed a large spur displacing the proximal L5 nerve root this was all removed decompressing the L5 nerve root I was unable to easily pass a coronary dilator out the L5 foramen as well as from the disc space in the canal to the extra foraminal space and out the L4 foramen in its entirety.  No further stenosis was appreciated no further disc material was appreciated the wounds were copiously irrigated Gelfoam was ablating ~both laminotomies and wound was closed in layers with inverted Vicryl skin was closed running 4 subcuticular Dermabond benzoin Steri-Strips and a sterile dressing was applied patient recovery room in stable condition.  At the end the case and account sponge counts were correct.

## 2018-07-08 NOTE — H&P (Signed)
Stacey Sloan is an 77 y.o. female.   Chief Complaint: Back and right leg pain HPI: 77 year old female with a history of back and right leg pain with weakness in the right foot.  This been refractory to all forms of conservative treatment work-up revealed severe stenosis and nerve compression on the right L4 and L5 nerve root.  So due to for conservative treatment progression of clinical syndrome imaging findings I recommended laminectomy decompression L4-5 with extraforaminal decompression possible discectomy both extraforaminal he and in the canal at L4-5.  I also reviewed the risks and benefits perioperative course expectations of outcome and alternatives of surgery and they understand and agreed to proceed forward.  Past Medical History:  Diagnosis Date  . Arthritis    Back  . Diabetes mellitus without complication ([place])    Type II  . Hypertension     Past Surgical History:  Procedure Laterality Date  . EYE SURGERY     cataract    History reviewed. No pertinent family history. Social History:  reports that she has never smoked. She has never used smokeless tobacco. She reports that she does not drink alcohol or use drugs.  Allergies: No Known Allergies  Medications Prior to Admission  Medication Sig Dispense Refill  . amLODipine (NORVASC) 10 MG tablet Take 10 mg by mouth daily.     Marland Kitchen gabapentin (NEURONTIN) 300 MG capsule Take 1 capsule (300 mg total) by mouth at bedtime. 30 capsule 2  . glipiZIDE-metformin (METAGLIP) 5-500 MG tablet Take 1 tablet by mouth 2 (two) times daily.     Marland Kitchen lisinopril-hydrochlorothiazide (PRINZIDE,ZESTORETIC) 20-25 MG tablet Take 1 tablet by mouth daily.     . Multiple Vitamins-Minerals (MULTIVITAMIN PO) Take 1 tablet by mouth daily.    . diazepam (VALIUM) 2 MG tablet Take 1 tablet 30 minutes prior to procedure.  May repeat x 1 (Patient not taking: Reported on 07/04/2018) 2 tablet 0  . diclofenac sodium (VOLTAREN) 1 % GEL Apply 4 g topically 4 (four) times  daily. (Patient not taking: Reported on 07/04/2018) 5 Tube 1  . ondansetron (ZOFRAN) 4 MG tablet Take 1 tablet (4 mg total) by mouth every 6 (six) hours as needed for nausea or vomiting. (Patient not taking: Reported on 07/04/2018) 20 tablet 0    Results for orders placed or performed during the hospital encounter of 07/08/18 (from the past 48 hour(s))  Glucose, capillary     Status: Abnormal   Collection Time: 07/08/18 12:07 PM  Result Value Ref Range   Glucose-Capillary 170 (H) 70 - 99 mg/dL  Hemoglobin     Status: None   Collection Time: 07/08/18 12:20 PM  Result Value Ref Range   Hemoglobin 13.1 12.0 - 15.0 g/dL    Comment: Performed at [facility], [place] [address], [place], [place] [address]  Hemoglobin A1c     Status: Abnormal   Collection Time: 07/08/18 12:20 PM  Result Value Ref Range   Hgb A1c MFr Bld 7.0 (H) 4.8 - 5.6 %    Comment: (NOTE) Pre diabetes:          5.7%-6.4% Diabetes:              >6.4% Glycemic control for   <7.0% adults with diabetes    Mean Plasma Glucose 154.2 mg/dL    Comment: Performed at [facility] [address] [place], [place] [address]  Basic metabolic panel     Status: Abnormal   Collection Time: 07/08/18 12:20 PM  Result Value Ref Range   Sodium 134 (L) 135 - 145 mmol/L   Potassium 3.4 (L) 3.5 - 5.1 mmol/L   Chloride 91 (L) 98 - 111 mmol/L   CO2 26 22 - 32 mmol/L   Glucose, Bld 186 (H) 70 - 99 mg/dL   BUN 17 8 - 23 mg/dL   Creatinine, Ser 0.79 0.44 - 1.00 mg/dL   Calcium 9.7 8.9 - 10.3 mg/dL   GFR calc non Af Amer >60 >60 mL/min   GFR calc Af Amer >60 >60 mL/min    Comment: (NOTE) The eGFR has been calculated using the CKD EPI equation. This calculation has not been validated in all clinical situations. eGFR's persistently <60 mL/min signify possible Chronic Kidney Disease.    Anion gap 17 (H) 5 - 15    Comment: Performed at [facility], [place] [address] [place], [place] [address]   No results found.  Review of  Systems  Musculoskeletal: Positive for back pain.  Neurological: Positive for tingling and sensory change.    Blood pressure (!) 149/77, pulse (!) 101, temperature 98.6 F (37 C), temperature source Oral, resp. rate 20, height 5' 4"  (1.626 m), weight 56.7 kg, SpO2 100 %. Physical Exam  Constitutional: She is oriented to person, place, and time. She appears well-developed and well-nourished.  HENT:  Head: Normocephalic.  Eyes: Pupils are equal, round, and reactive to light.  Neck: Normal range of motion.  Cardiovascular: Normal rate.  Respiratory: Effort normal.  GI: Soft.  Neurological: She is alert and oriented to person, place, and time. She has normal strength. GCS eye subscore is 4. GCS verbal subscore is 5. GCS motor subscore is 6.  Patient is awake and alert left leg is 5 out of 5 strength right leg has weakness in dorsiflexion of the right foot at 3+ to 4- out of 5  Skin: Skin is warm and dry.     Assessment/Plan 77 year old female presents for L4-5 decompressive laminectomy both intra-and extraforaminal decompression.  Anuoluwapo Mefferd P, MD 07/08/2018, 1:09 PM

## 2018-07-09 ENCOUNTER — Encounter (HOSPITAL_COMMUNITY): Payer: Self-pay | Admitting: Neurosurgery

## 2018-07-09 DIAGNOSIS — M48061 Spinal stenosis, lumbar region without neurogenic claudication: Secondary | ICD-10-CM | POA: Diagnosis not present

## 2018-07-09 LAB — GLUCOSE, CAPILLARY: GLUCOSE-CAPILLARY: 181 mg/dL — AB (ref 70–99)

## 2018-07-09 NOTE — Discharge Summary (Signed)
  Physician Discharge Summary  Patient ID: Stacey Sloan MRN: [id number] DOB/AGE: [birth year] 77 y.o. Estimated body mass index is 21.46 kg/m as calculated from the following:   Height as of this encounter: 5\' 4"  (1.626 m).   Weight as of this encounter: 56.7 kg.   Admit date: 07/08/2018 Discharge date: 07/09/2018  Admission Diagnoses: Lumbar spinal stenosis right L4-L5 radiculopathy herniated nucleus pulposus  Discharge Diagnoses: Same Active Problems:   Spinal stenosis at L4-L5 level   Discharged Condition: good  Hospital Course: Patient was admitted to hospital underwent decompressive laminectomy discectomy L4-5 and the right postoperatively patient did very well and covering the floor in the floor was ablating and voiding spontaneously tolerating regular diet stable for discharge home.  Consults: Significant Diagnostic Studies: Treatments: Decompressive laminectomy and discectomy L4-5 Discharge Exam: Blood pressure 136/78, pulse 67, temperature 97.7 F (36.5 C), temperature source Oral, resp. rate 18, height 5\' 4"  (1.626 m), weight 56.7 kg, SpO2 99 %. Strength 5 out of 5 wound clean and dry  Disposition: Home   Allergies as of 07/09/2018   No Known Allergies     Medication List    TAKE these medications   amLODipine 10 MG tablet Commonly known as:  NORVASC Take 10 mg by mouth daily.   diazepam 2 MG tablet Commonly known as:  VALIUM Take 1 tablet 30 minutes prior to procedure.  May repeat x 1   diclofenac sodium 1 % Gel Commonly known as:  VOLTAREN Apply 4 g topically 4 (four) times daily.   gabapentin 300 MG capsule Commonly known as:  NEURONTIN Take 1 capsule (300 mg total) by mouth at bedtime.   glipiZIDE-metformin 5-500 MG tablet Commonly known as:  METAGLIP Take 1 tablet by mouth 2 (two) times daily.   lisinopril-hydrochlorothiazide 20-25 MG tablet Commonly known as:  PRINZIDE,ZESTORETIC Take 1 tablet by mouth daily.   MULTIVITAMIN PO Take 1  tablet by mouth daily.   ondansetron 4 MG tablet Commonly known as:  ZOFRAN Take 1 tablet (4 mg total) by mouth every 6 (six) hours as needed for nausea or vomiting.        Signed: Amiera Herzberg P 07/09/2018, 7:42 AM

## 2018-07-09 NOTE — Progress Notes (Signed)
Pt and family given D/C instructions with verbal understanding. Pt's incision is clean and dry with no sign of infection. Pt's IV was removed prior to D/C. Pt D/C'd home via wheelchair @ 1135 per MD order. Pt is stable @ D/C and has no other needs at this time. Rema FendtAshley Cordaryl Decelles, RN

## 2018-07-09 NOTE — Evaluation (Signed)
Occupational Therapy Evaluation Patient Details Name: Stacey Sloan MRN: [id number] DOB: 06/23/1941 Today's Date: 07/09/2018    History of Present Illness  77 yo female s/p decompressive lumbar laminectomy L4-5. PMH including HTN, diabetes type 2, and arthritis.   Clinical Impression   PTA, pt was living with her husband and was independent; planning for son to stay at dc. Currently, pt requires Min Guard A for LB ADLs and Min A for functional mobility with single hand held (for short distance in room). Provided handout and education on back precautions, bed mobility, LB ADLs, toileting, and tub transfer; pt demonstrated understanding. Answered all pt questions. Recommend dc home once medically stable per physician. All acute OT needs met and will sign off. Thank you.     Follow Up Recommendations  No OT follow up;Supervision/Assistance - 24 hour    Equipment Recommendations  None recommended by OT    Recommendations for Other Services PT consult     Precautions / Restrictions Precautions Precautions: Back Precaution Booklet Issued: Yes (comment) Precaution Comments: Reviewed all back precautions and compensatory techniques for adherance during ADLs Required Braces or Orthoses: Other Brace/Splint Other Brace/Splint: "No brace" per MD order Restrictions Weight Bearing Restrictions: No      Mobility Bed Mobility               General bed mobility comments: Pt in recliner. Pt able to verablize steps for log roll technique  Transfers Overall transfer level: Needs assistance Equipment used: None Transfers: Sit to/from Stand Sit to Stand: Min guard         General transfer comment: Min Guard A for safety    Balance Overall balance assessment: Needs assistance Sitting-balance support: No upper extremity supported;Feet supported Sitting balance-Leahy Scale: Good     Standing balance support: No upper extremity supported;During functional activity Standing  balance-Leahy Scale: Fair Standing balance comment: Able to maintian standing for LB dressing. Required UE support for functional mobility                           ADL either performed or assessed with clinical judgement   ADL Overall ADL's : Needs assistance/impaired Eating/Feeding: Set up;Supervision/ safety;Sitting   Grooming: Min guard;Standing Grooming Details (indicate cue type and reason): Educating pt on using cup for brushing tech to prevent leaning over sink Upper Body Bathing: Supervision/ safety;Sitting   Lower Body Bathing: Min guard;Sit to/from stand   Upper Body Dressing : Set up;Supervision/safety;Sitting Upper Body Dressing Details (indicate cue type and reason): Pt donning bra and shirt while seated Lower Body Dressing: Min guard;Sit to/from stand Lower Body Dressing Details (indicate cue type and reason): Min Guard A for safety Toilet Transfer: Min guard;Ambulation(Simulated to recliner)     Toileting - Clothing Manipulation Details (indicate cue type and reason): Educating pt on compensatory techniques for toilet hygiene and adherance to back precautions Tub/ Shower Transfer: Minimal assistance;Ambulation;Tub transfer Tub/Shower Transfer Details (indicate cue type and reason): Educating pt on tub transfer. Requiring Min A for safety Functional mobility during ADLs: Minimal assistance(Single hand held A) General ADL Comments: Pt demonstrating understanding of education on compensatory tehcniques for LB ADLs, toileting, and functional transfers     Vision         Perception     Praxis      Pertinent Vitals/Pain Pain Assessment: Faces Faces Pain Scale: Hurts little more Pain Location: Back Pain Descriptors / Indicators: Constant;Discomfort;Grimacing Pain Intervention(s): Monitored during session;Repositioned  Hand Dominance     Extremity/Trunk Assessment Upper Extremity Assessment Upper Extremity Assessment: Overall WFL for tasks  assessed   Lower Extremity Assessment Lower Extremity Assessment: Defer to PT evaluation   Cervical / Trunk Assessment Cervical / Trunk Assessment: Other exceptions Cervical / Trunk Exceptions: s/p decompressive lumbar laminectomy L4-5   Communication Communication Communication: No difficulties   Cognition Arousal/Alertness: Awake/alert Behavior During Therapy: WFL for tasks assessed/performed Overall Cognitive Status: Within Functional Limits for tasks assessed                                     General Comments  Daughter, Jewel, present at begining of session    Exercises     Shoulder Instructions      Home Living Family/patient expects to be discharged to:: Private residence Living Arrangements: Spouse/significant other Available Help at Discharge: Family;Available 24 hours/day Type of Home: House       Home Layout: One level     Bathroom Shower/Tub: Tub/shower unit;Door   [employer] Toilet: Standard     Home Equipment: None          Prior Functioning/Environment Level of Independence: Independent        Comments: Enjoys reading.        OT Problem List: Decreased range of motion;Decreased activity tolerance;Impaired balance (sitting and/or standing);Decreased safety awareness;Decreased knowledge of use of DME or AE;Decreased knowledge of precautions;Pain      OT Treatment/Interventions:      OT Goals(Current goals can be found in the care plan section) Acute Rehab OT Goals Patient Stated Goal: "Go home" OT Goal Formulation: All assessment and education complete, DC therapy  OT Frequency:     Barriers to D/C:            Co-evaluation              AM-PAC PT "6 Clicks" Daily Activity     Outcome Measure Help from another person eating meals?: None Help from another person taking care of personal grooming?: None Help from another person toileting, which includes using toliet, bedpan, or urinal?: A Little Help from  another person bathing (including washing, rinsing, drying)?: A Little Help from another person to put on and taking off regular upper body clothing?: None Help from another person to put on and taking off regular lower body clothing?: A Little 6 Click Score: 21   End of Session Nurse Communication: Mobility status  Activity Tolerance: Patient tolerated treatment well Patient left: in chair;with call bell/phone within reach  OT Visit Diagnosis: Unsteadiness on feet (R26.81);Other abnormalities of gait and mobility (R26.89);Muscle weakness (generalized) (M62.81);Pain Pain - part of body: (Back)                Time: 2481-8590 OT Time Calculation (min): 16 min Charges:  OT General Charges $OT Visit: 1 Visit OT Evaluation $OT Eval Low Complexity: 1 Low  Jarissa Sheriff MSOT, OTR/L Acute Rehab Pager: [phone] Office: [place] 07/09/2018, 10:03 AM

## 2018-07-09 NOTE — Progress Notes (Signed)
Physical Therapy Evaluation Patient Details Name: Stacey Sloan MRN: [id number] DOB: 1941/01/01 Today's Date: 07/09/2018   History of Present Illness   77 yo female s/p decompressive lumbar laminectomy L4-5. PMH including HTN, diabetes type 2, and arthritis.  Clinical Impression  Patient is s/p above surgery resulting in deficits listed below (see PT Problem List). At the time of evaluation pt performed ambulation with gross min G with RW for safety. Pt and family member educated on positioning, car transfers, generalized walking program, and stair negotiation with appropriate guarding. Recommending RW at d/c to maximize safety with post-op mobility. Acutely, patient will benefit from skilled PT to increase their independence and safety with mobility to allow discharge to the venue listed below.       Follow Up Recommendations No PT follow up;Supervision/Assistance - 24 hour    Equipment Recommendations  Rolling walker with 5" wheels    Recommendations for Other Services       Precautions / Restrictions Precautions Precautions: Back Precaution Booklet Issued: Yes (comment) Precaution Comments: Reviewed precautions in full with pt.  Required Braces or Orthoses: Other Brace/Splint Other Brace/Splint: "No brace" per MD order Restrictions Weight Bearing Restrictions: No      Mobility  Bed Mobility               General bed mobility comments: Pt seated EOB finishing breafast upon arrival. Verbally reviewed log roll with pt.   Transfers Overall transfer level: Needs assistance Equipment used: Rolling walker (2 wheeled) Transfers: Sit to/from Stand Sit to Stand: Min guard         General transfer comment: Min Guard as pt demonstrating minor shakiness in bil LE upon standing. Min cues for safe hand placement with sit<>stand.   Ambulation/Gait Ambulation/Gait assistance: Min guard Gait Distance (Feet): 150 Feet Assistive device: Rolling walker (2 wheeled) Gait  Pattern/deviations: Step-through pattern;Decreased step length - right;Decreased stance time - right;Antalgic;Trunk flexed Gait velocity: decreased Gait velocity interpretation: <1.31 ft/sec, indicative of household ambulator General Gait Details: Pt requiring min G for ambulation with increased guarding on pt's right side secondary to reported weakness on RLE. Min cues for upright posture, proximity to RW, and looking forward. VCs for heel strike to toe off on RLE.   Stairs Stairs: Yes Stairs assistance: Min assist Stair Management: One rail Left;One rail Right;Step to pattern;Forwards Number of Stairs: 10 General stair comments: Pt ascended forwards with HHA+1 on the right and rail on left. Initially ascended with LLE leading, demonstrating LLE knee buckling when shifting weight onto LLE to progress RLE. Pt then swtiched after 2 steps and advanced with RLE leading with improved stabiltiy and balance. Continued to ascend forwards with RLE leading without any problems. Initally descended with LLE leading and pt's hand resting on therapy student's shoulder. Pt switched back to RLE leading after 2-3 steps with notable LLE knee buckle. Cued to descend with LLE leading with improved safety. Verbally discussed safe guarding and assist with pt's daughter and importance of ascending with RLE leading and descend with LLE leading to improve safety with stairs.   Wheelchair Mobility    Modified Rankin (Stroke Patients Only)       Balance Overall balance assessment: Needs assistance Sitting-balance support: No upper extremity supported;Feet supported Sitting balance-Leahy Scale: Good     Standing balance support: During functional activity;Bilateral upper extremity supported Standing balance-Leahy Scale: Poor Standing balance comment: Reliant on Bil UE support for dynamic standing activities.  Pertinent Vitals/Pain Pain Assessment: Faces Faces Pain Scale:  Hurts little more Pain Location: Back Pain Descriptors / Indicators: Discomfort;Grimacing;Aching Pain Intervention(s): Limited activity within patient's tolerance;Monitored during session;Repositioned    Home Living Family/patient expects to be discharged to:: Private residence Living Arrangements: Spouse/significant other Available Help at Discharge: Family;Available 24 hours/day Type of Home: House Home Access: Stairs to enter Entrance Stairs-Rails: Left Entrance Stairs-Number of Steps: 3  Home Layout: Two level;Bed/bath upstairs Home Equipment: Cane - single point Additional Comments: PTA utilized wall and rail to ascend/descend to second level where bedroom and bathroom are    Prior Function Level of Independence: Independent         Comments: Enjoys reading.     Hand Dominance        Extremity/Trunk Assessment   Upper Extremity Assessment Upper Extremity Assessment: Defer to OT evaluation    Lower Extremity Assessment Lower Extremity Assessment: LLE deficits/detail;RLE deficits/detail RLE Deficits / Details: Pt reports RLE is weaker than LLE, but RLE providing more support/stability with stair negotiation     Cervical / Trunk Assessment Cervical / Trunk Assessment: Other exceptions Cervical / Trunk Exceptions: s/p decompressive lumbar laminectomy L4-5  Communication   Communication: No difficulties  Cognition Arousal/Alertness: Awake/alert Behavior During Therapy: WFL for tasks assessed/performed Overall Cognitive Status: Within Functional Limits for tasks assessed                                        General Comments General comments (skin integrity, edema, etc.): Daughter present during session; Pt given a gait belt to d/c with    Exercises     Assessment/Plan    PT Assessment Patient needs continued PT services  PT Problem List Decreased strength;Decreased range of motion;Decreased activity tolerance;Decreased balance;Decreased  coordination;Decreased mobility;Decreased knowledge of use of DME;Decreased knowledge of precautions;Decreased safety awareness;Pain       PT Treatment Interventions DME instruction;Gait training;Stair training;Functional mobility training;Therapeutic activities;Therapeutic exercise;Balance training;Patient/family education;Wheelchair mobility training;Modalities;Manual techniques    PT Goals (Current goals can be found in the Care Plan section)  Acute Rehab PT Goals Patient Stated Goal: "Go home" PT Goal Formulation: With patient Time For Goal Achievement: 07/17/18 Potential to Achieve Goals: Good    Frequency Min 5X/week   Barriers to discharge        Co-evaluation               AM-PAC PT "6 Clicks" Daily Activity  Outcome Measure Difficulty turning over in bed (including adjusting bedclothes, sheets and blankets)?: A Little Difficulty moving from lying on back to sitting on the side of the bed? : A Little Difficulty sitting down on and standing up from a chair with arms (e.g., wheelchair, bedside commode, etc,.)?: A Little Help needed moving to and from a bed to chair (including a wheelchair)?: A Little Help needed walking in hospital room?: A Little Help needed climbing 3-5 steps with a railing? : A Little 6 Click Score: 18    End of Session Equipment Utilized During Treatment: Gait belt Activity Tolerance: Patient tolerated treatment well Patient left: in chair;with call bell/phone within reach;with family/visitor present Nurse Communication: Mobility status(OT order ) PT Visit Diagnosis: Unsteadiness on feet (R26.81);Other abnormalities of gait and mobility (R26.89);Muscle weakness (generalized) (M62.81);Difficulty in walking, not elsewhere classified (R26.2);Pain Pain - part of body: (back incision)    Time: 1610-9604 PT Time Calculation (min) (ACUTE ONLY): 35 min   Charges:  PT Evaluation $PT Eval Moderate Complexity: 1 Mod PT Treatments $Gait Training:  8-22 mins        Donzetta KohutKaylee Tyshay Adee, [place]  Student Physical Therapist Acute Rehab [phone]   Donzetta KohutKaylee Takyla Kuchera 07/09/2018, 12:09 PM

## 2018-07-09 NOTE — Anesthesia Postprocedure Evaluation (Signed)
Anesthesia Post Note  Patient: Stacey Sloan  Procedure(s) Performed: Laminectomy and Foraminotomy - Lumbar four - Lumbar five - right intra- and extraforaminal (Right Back)     Patient location during evaluation: PACU Anesthesia Type: General Level of consciousness: awake and alert Pain management: pain level controlled Vital Signs Assessment: post-procedure vital signs reviewed and stable Respiratory status: spontaneous breathing, nonlabored ventilation, respiratory function stable and patient connected to nasal cannula oxygen Cardiovascular status: blood pressure returned to baseline and stable Postop Assessment: no apparent nausea or vomiting Anesthetic complications: no    Last Vitals:  Vitals:   07/09/18 0345 07/09/18 0742  BP: 136/78 (!) 155/74  Pulse: 67 76  Resp: 18 18  Temp: 36.5 C   SpO2: 99% 96%    Last Pain:  Vitals:   07/09/18 0815  TempSrc:   PainSc: 2    Pain Goal: Patients Stated Pain Goal: 2 (07/09/18 0815)               Trevor IhaStephen A Kelce Bouton

## 2019-06-02 ENCOUNTER — Other Ambulatory Visit: Payer: Self-pay | Admitting: Family Medicine

## 2020-01-01 IMAGING — CR DG FEMUR 2+V*R*
4 series · 4 of 4 positions shown · non-contrast
Comparison: None.

CLINICAL DATA: Right lower back and right leg pain for 5 days
without known injury.

EXAM:
RIGHT FEMUR 2 VIEWS

[t femur with hip  ap right]
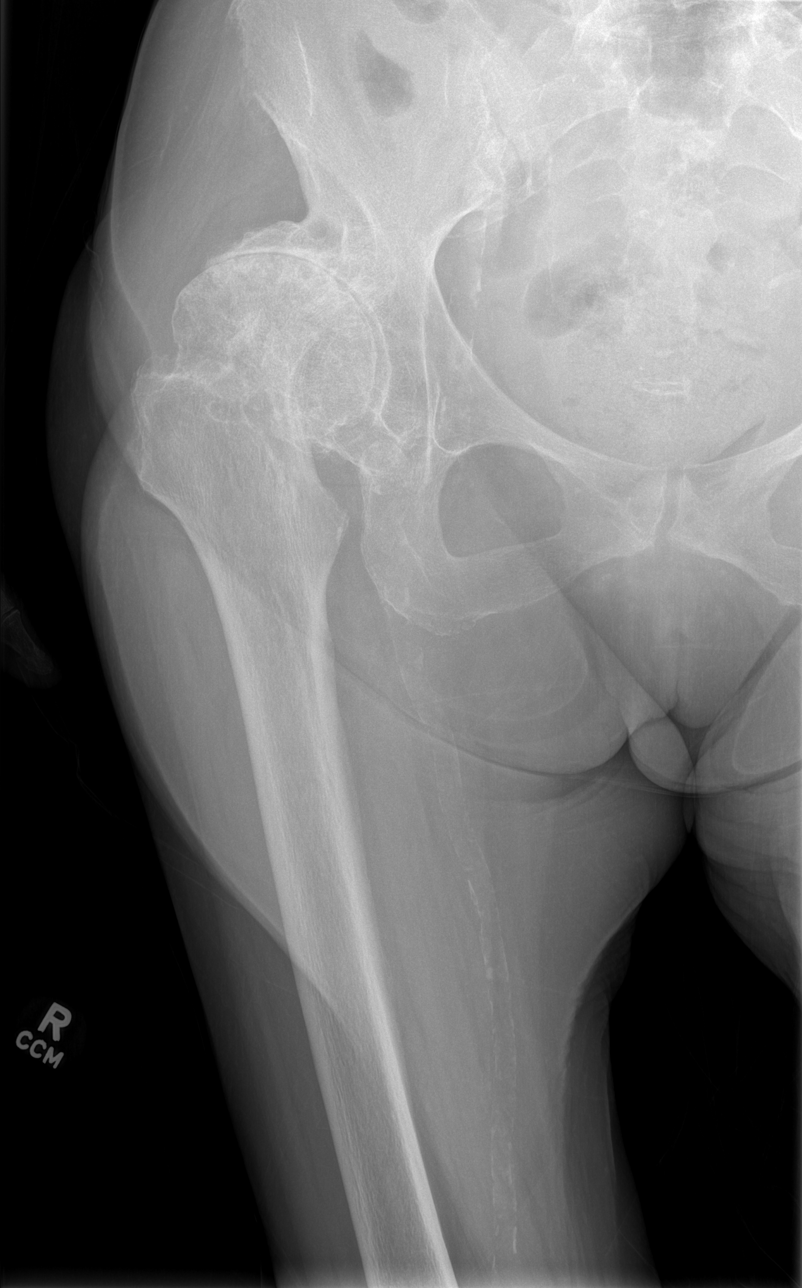

[t femur with knee ap right]
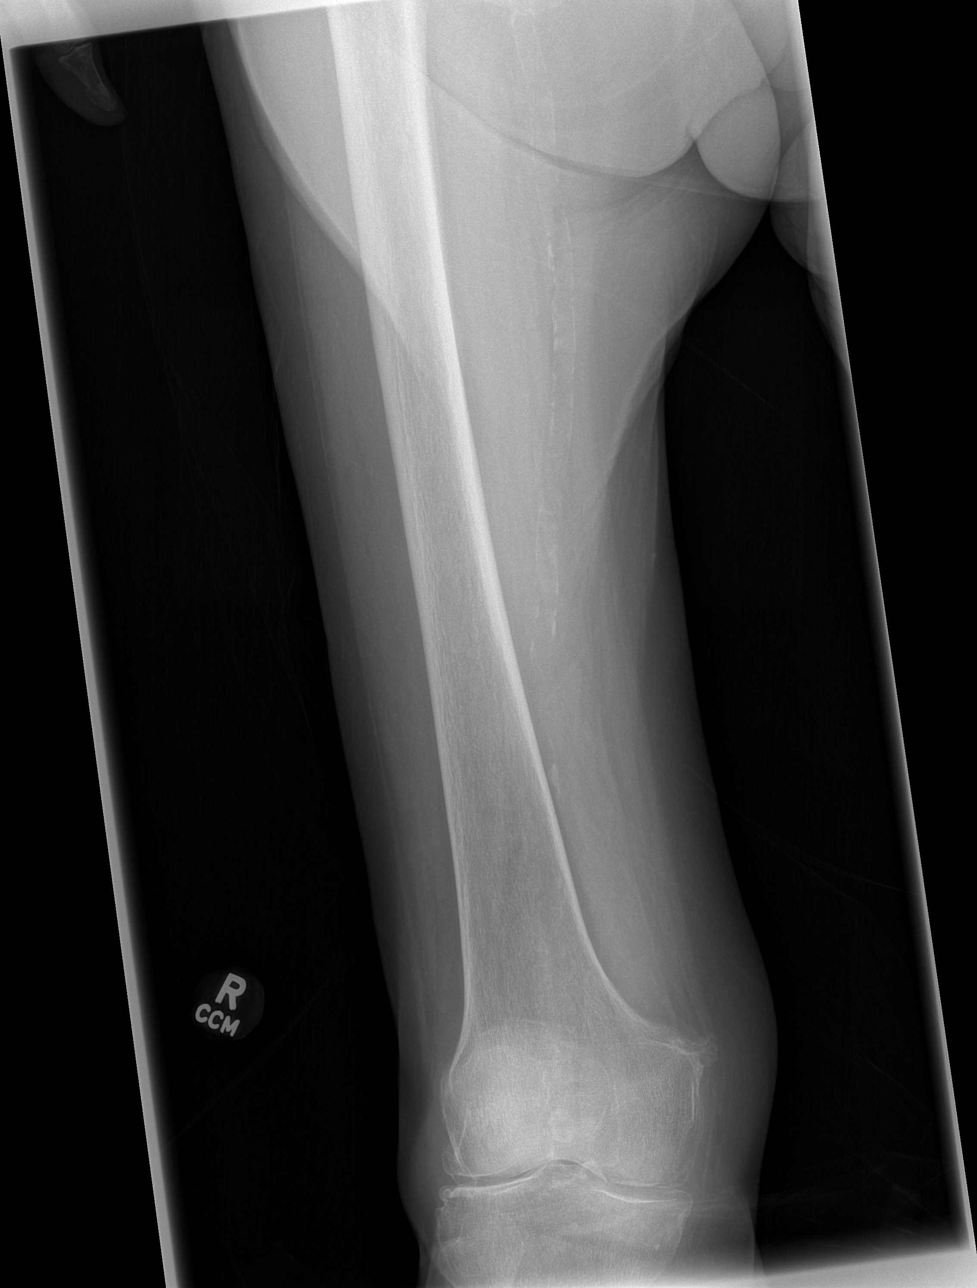

[t femur with knee lat right]
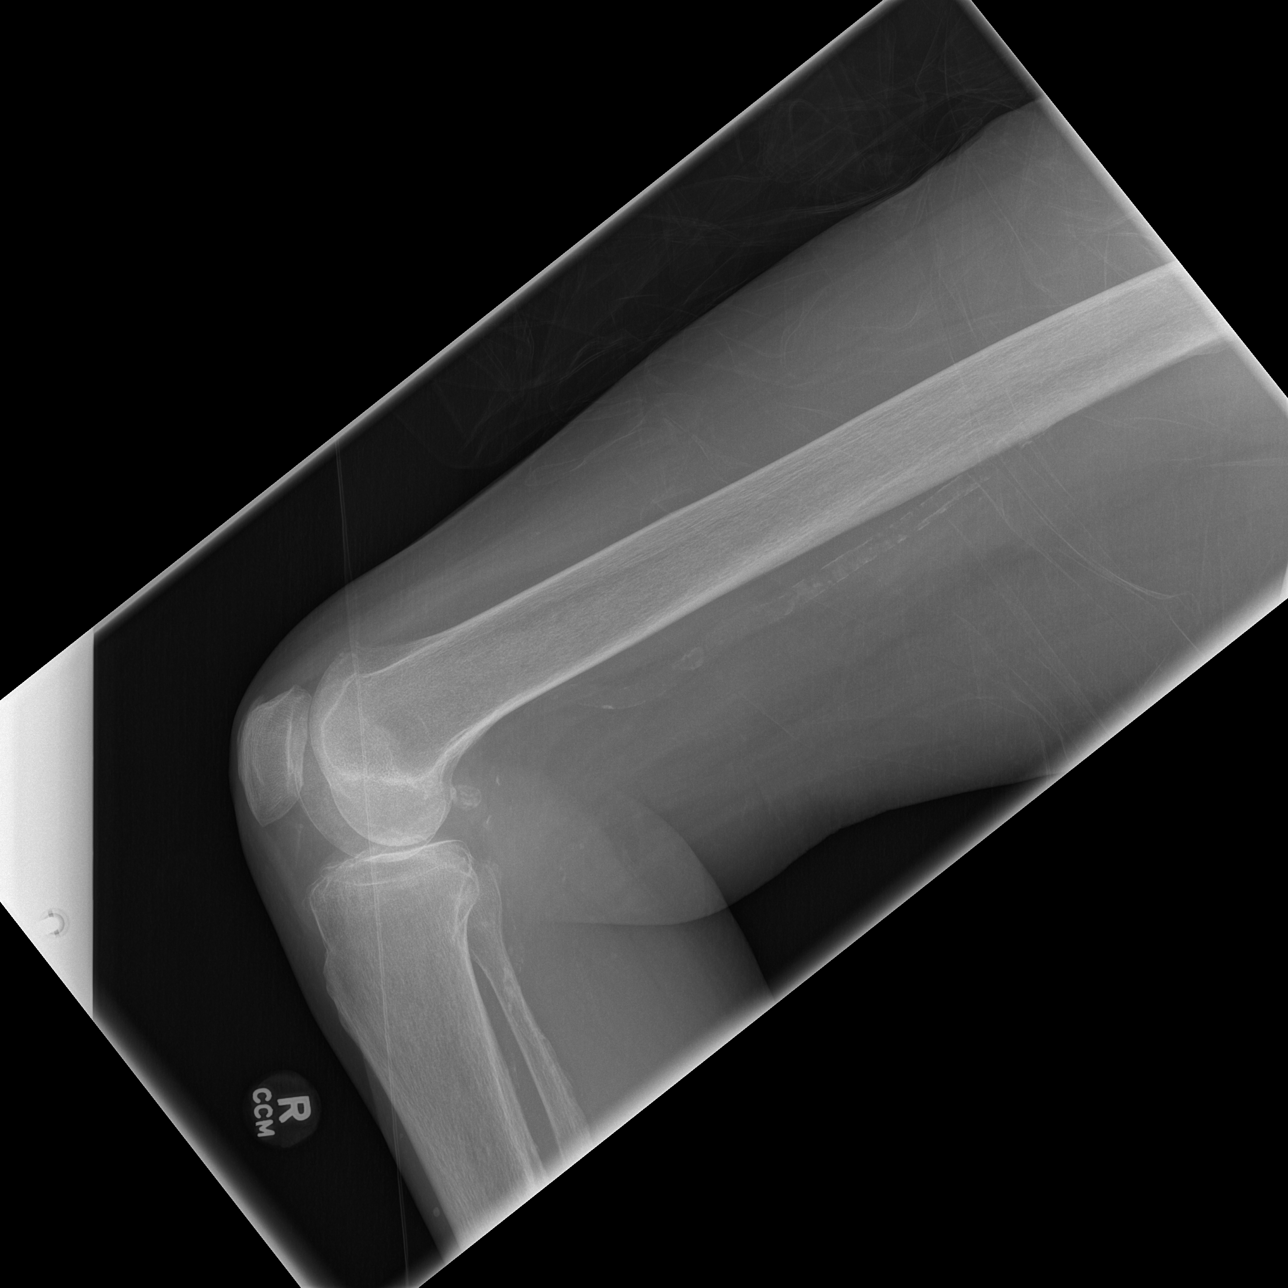

[t femur with hip lat right]
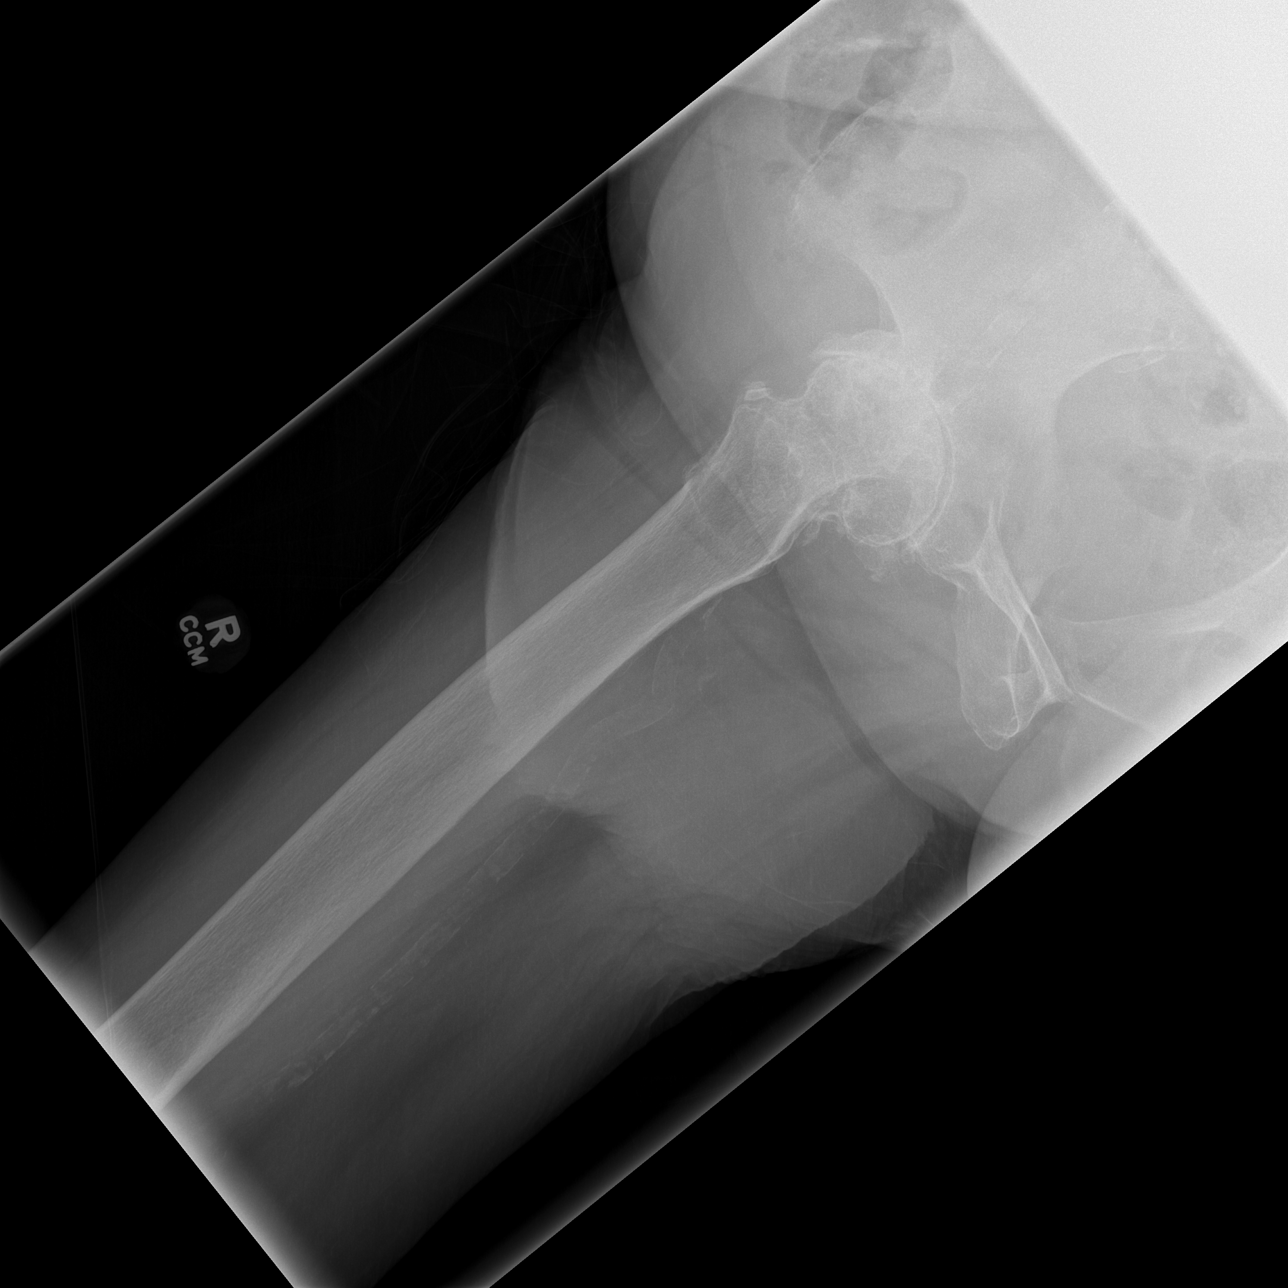

[4 of 4 positions shown; findings below may reference images not displayed]

FINDINGS: There is no evidence of fracture or other focal bone lesions.
Vascular calcifications are noted.
IMPRESSION: No acute abnormality seen in the right femur. Severe degenerative
joint disease of right hip is noted.

## 2024-05-11 ENCOUNTER — Other Ambulatory Visit: Payer: Self-pay

## 2024-05-11 ENCOUNTER — Emergency Department (HOSPITAL_BASED_OUTPATIENT_CLINIC_OR_DEPARTMENT_OTHER)
Admission: EM | Admit: 2024-05-11 | Discharge: 2024-05-11 | Disposition: A | Discharge status: Home / Self Care | Attending: Emergency Medicine | Admitting: Emergency Medicine

## 2024-05-11 ENCOUNTER — Emergency Department (HOSPITAL_BASED_OUTPATIENT_CLINIC_OR_DEPARTMENT_OTHER)

## 2024-05-11 ENCOUNTER — Encounter (HOSPITAL_BASED_OUTPATIENT_CLINIC_OR_DEPARTMENT_OTHER): Payer: Self-pay | Admitting: Emergency Medicine

## 2024-05-11 DIAGNOSIS — R531 Weakness: Secondary | ICD-10-CM | POA: Diagnosis present

## 2024-05-11 DIAGNOSIS — E119 Type 2 diabetes mellitus without complications: Secondary | ICD-10-CM | POA: Insufficient documentation

## 2024-05-11 DIAGNOSIS — R142 Eructation: Secondary | ICD-10-CM | POA: Insufficient documentation

## 2024-05-11 DIAGNOSIS — Z79899 Other long term (current) drug therapy: Secondary | ICD-10-CM | POA: Diagnosis not present

## 2024-05-11 DIAGNOSIS — I10 Essential (primary) hypertension: Secondary | ICD-10-CM | POA: Diagnosis not present

## 2024-05-11 DIAGNOSIS — R002 Palpitations: Secondary | ICD-10-CM | POA: Diagnosis not present

## 2024-05-11 DIAGNOSIS — Z7984 Long term (current) use of oral hypoglycemic drugs: Secondary | ICD-10-CM | POA: Insufficient documentation

## 2024-05-11 DIAGNOSIS — R42 Dizziness and giddiness: Secondary | ICD-10-CM | POA: Diagnosis not present

## 2024-05-11 LAB — MAGNESIUM: Magnesium: 1.6 mg/dL — ABNORMAL LOW (ref 1.7–2.4)

## 2024-05-11 LAB — URINALYSIS, ROUTINE W REFLEX MICROSCOPIC
Bilirubin Urine: NEGATIVE
Glucose, UA: 250 mg/dL — AB
Hgb urine dipstick: NEGATIVE
Ketones, ur: NEGATIVE mg/dL
Leukocytes,Ua: NEGATIVE
Nitrite: NEGATIVE
Protein, ur: NEGATIVE mg/dL
Specific Gravity, Urine: 1.015 (ref 1.005–1.030)
pH: 5.5 (ref 5.0–8.0)

## 2024-05-11 LAB — CBC
HCT: 35.7 % — ABNORMAL LOW (ref 36.0–46.0)
Hemoglobin: 11.4 g/dL — ABNORMAL LOW (ref 12.0–15.0)
MCH: 26.6 pg (ref 26.0–34.0)
MCHC: 31.9 g/dL (ref 30.0–36.0)
MCV: 83.2 fL (ref 80.0–100.0)
Platelets: 378 K/uL (ref 150–400)
RBC: 4.29 MIL/uL (ref 3.87–5.11)
RDW: 13.9 % (ref 11.5–15.5)
WBC: 9 K/uL (ref 4.0–10.5)
nRBC: 0 % (ref 0.0–0.2)

## 2024-05-11 LAB — BASIC METABOLIC PANEL WITH GFR
Anion gap: 17 — ABNORMAL HIGH (ref 5–15)
BUN: 25 mg/dL — ABNORMAL HIGH (ref 8–23)
CO2: 21 mmol/L — ABNORMAL LOW (ref 22–32)
Calcium: 10.3 mg/dL (ref 8.9–10.3)
Chloride: 99 mmol/L (ref 98–111)
Creatinine, Ser: 0.98 mg/dL (ref 0.44–1.00)
GFR, Estimated: 57 mL/min — ABNORMAL LOW (ref >60)
Glucose, Bld: 162 mg/dL — ABNORMAL HIGH (ref 70–99)
Potassium: 4.4 mmol/L (ref 3.5–5.1)
Sodium: 137 mmol/L (ref 135–145)

## 2024-05-11 LAB — TROPONIN T, HIGH SENSITIVITY
Troponin T High Sensitivity: <15 ng/L (ref <19)
Troponin T High Sensitivity: <15 ng/L (ref <19)

## 2024-05-11 MED ORDER — MECLIZINE HCL 12.5 MG PO TABS: 25.0000 mg | ORAL_TABLET | Freq: Three times a day (TID) | ORAL | 0 refills | Status: AC | PRN

## 2024-05-11 MED ORDER — ALUM & MAG HYDROXIDE-SIMETH 200-200-20 MG/5ML PO SUSP
30.0000 mL | Freq: Once | ORAL | Status: AC
  Administered 2024-05-11: 30 mL via ORAL
  Filled 2024-05-11: qty 30

## 2024-05-11 MED ORDER — LIDOCAINE VISCOUS HCL 2 % MT SOLN
15.0000 mL | Freq: Once | OROMUCOSAL | Status: AC
  Administered 2024-05-11: 15 mL via ORAL
  Filled 2024-05-11: qty 15

## 2024-05-11 MED ORDER — MAGNESIUM OXIDE -MG SUPPLEMENT 400 (240 MG) MG PO TABS
400.0000 mg | ORAL_TABLET | Freq: Once | ORAL | Status: AC
  Administered 2024-05-11: 400 mg via ORAL
  Filled 2024-05-11: qty 1

## 2024-05-11 NOTE — ED Triage Notes (Addendum)
 Pt POV c/o palpitations since this AM. Also reports weakness/fatigue x1-2 months, and ongoing issues with indigestion.   Pt reports hx of hyponatremia.

## 2024-05-11 NOTE — Discharge Instructions (Addendum)
 Thank you for letting us  evaluate you today.  Your cardiac enzymes were negative for cardiac injury.  Your EKG looked normal.  Your chest x-ray did not show any signs of fluid or pneumonia.  Your other lab work is notable for a mildly decreased magnesium .  We provided supplementation here in emergency department.  You do not have any orthostatic hypotension/lowering blood pressure with position changes.  Your urine does not appear infected.  You have mildly decreased blood count.  Return to Emergency Department if you experience chest pain, shortness of breath, dizziness at rest, severe dizziness causing you unable to walk, numbness or weakness on one side your body, altered mentation, seizures

## 2024-05-11 NOTE — ED Provider Notes (Signed)
 [facility] Provider Note   CSN: [id number] Arrival date & time: 05/11/24  1417     Patient presents with: Weakness and Palpitations   Stacey Sloan is a 83 y.o. female with past medical history of T2DM, HTN presents to ED for evaluation of generalized weakness, lightheadedness, fatigue that has been occurring over the past 1-2 weeks intermittently.  Reports that when she gets up in the morning or gets up from seated eating or walks that she gets lightheaded but this improves when she writes herself.  This has been intermittently occurring since January.  No history of diagnosed vertigo, AMS, confusion, numbness or weakness on one side of her body, slurred speech, aphasia  Also complains of increased burping today which initiated ED visit.  She started on omeprazole 1 month ago but stopped 2 weeks ago as she has hyponatremia and hypomagnesia.  Has had decreased appetite for a long time so started this successfully increasing appetite. Denies abd pain, constipation  Also complains of palpitations approximately 2 times a day that last couple seconds.  They do not hurt but feel more like a flutter.  This has been going on for 1-2 months.  They report that her PCP has told them that she has an extra beat.  She does not follow cardiology.  Denies shortness of breath     Weakness Palpitations Associated symptoms: weakness        Prior to Admission medications   Medication Sig Start Date End Date Taking? Authorizing Provider  meclizine  (ANTIVERT ) 12.5 MG tablet Take 2 tablets (25 mg total) by mouth 3 (three) times daily as needed for up to 5 days for dizziness. 05/11/24 05/16/24 Yes Minnie Tinnie BRAVO, PA  amLODipine  (NORVASC ) 10 MG tablet Take 10 mg by mouth daily.     [provider]  diazepam  (VALIUM ) 2 MG tablet Take 1 tablet 30 minutes prior to procedure.  May repeat x 1 Patient not taking: Reported on 07/04/2018 06/05/18   Cleatrice Ludie SAUNDERS, MD  diclofenac  sodium (VOLTAREN ) 1 % GEL APPLY 4 G TOPICALLY 4 (FOUR) TIMES DAILY. 06/02/19   Hudnall, Ludie SAUNDERS, MD  gabapentin  (NEURONTIN ) 300 MG capsule Take 1 capsule (300 mg total) by mouth at bedtime. 06/18/18   Hudnall, Ludie SAUNDERS, MD  glipiZIDE -metformin  (METAGLIP ) 5-500 MG tablet Take 1 tablet by mouth 2 (two) times daily.     [provider]  lisinopril -hydrochlorothiazide  (PRINZIDE ,ZESTORETIC ) 20-25 MG tablet Take 1 tablet by mouth daily.     [provider]  Multiple Vitamins-Minerals (MULTIVITAMIN PO) Take 1 tablet by mouth daily.    [provider]  ondansetron  (ZOFRAN ) 4 MG tablet Take 1 tablet (4 mg total) by mouth every 6 (six) hours as needed for nausea or vomiting. Patient not taking: Reported on 07/04/2018 05/31/18   Cleatrice Ludie SAUNDERS, MD    Allergies: Patient has no known allergies.    Review of Systems  Cardiovascular:  Positive for palpitations.  Neurological:  Positive for weakness.    Updated Vital Signs BP 134/75   Pulse 66   Temp 98.2 F (36.8 C) (Oral)   Resp 17   Ht 5' 4 (1.626 m)   Wt 50.3 kg   SpO2 99%   BMI 19.05 kg/m   Physical Exam Vitals and nursing note reviewed.  Constitutional:      General: She is not in acute distress.    Appearance: Normal appearance. She is not ill-appearing.  HENT:  Head: Normocephalic and atraumatic.  Eyes:     General: Lids are normal. Vision grossly intact. No visual field deficit.    Extraocular Movements:     Right eye: Normal extraocular motion and no nystagmus.     Left eye: Normal extraocular motion and no nystagmus.     Conjunctiva/sclera: Conjunctivae normal.  Neck:     Comments: No meningismus Cardiovascular:     Rate and Rhythm: Normal rate.  Pulmonary:     Effort: Pulmonary effort is normal. No respiratory distress.     Breath sounds: Normal breath sounds.  Abdominal:     General: Bowel sounds are normal. There is no distension.     Palpations: Abdomen is soft.      Tenderness: There is no abdominal tenderness. There is no right CVA tenderness, left CVA tenderness, guarding or rebound.  Musculoskeletal:     Cervical back: Normal range of motion and neck supple. No rigidity.     Right lower leg: No edema.     Left lower leg: No edema.  Skin:    Coloration: Skin is not jaundiced or pale.  Neurological:     Mental Status: She is alert and oriented to person, place, and time. Mental status is at baseline.     GCS: GCS eye subscore is 4. GCS verbal subscore is 5. GCS motor subscore is 6.     Cranial Nerves: No cranial nerve deficit, dysarthria or facial asymmetry.     Sensory: Sensation is intact. No sensory deficit.     Motor: No weakness, tremor, atrophy, abnormal muscle tone, seizure activity or pronator drift.     Coordination: Coordination normal. Finger-Nose-Finger Test and Heel to [place] Test normal.     Gait: Gait is intact.     (all labs ordered are listed, but only abnormal results are displayed) Labs Reviewed  BASIC METABOLIC PANEL WITH GFR - Abnormal; Notable for the following components:      Result Value   CO2 21 (*)    Glucose, Bld 162 (*)    BUN 25 (*)    GFR, Estimated 57 (*)    Anion gap 17 (*)    All other components within normal limits  CBC - Abnormal; Notable for the following components:   Hemoglobin 11.4 (*)    HCT 35.7 (*)    All other components within normal limits  MAGNESIUM  - Abnormal; Notable for the following components:   Magnesium  1.6 (*)    All other components within normal limits  URINALYSIS, ROUTINE W REFLEX MICROSCOPIC - Abnormal; Notable for the following components:   Glucose, UA 250 (*)    All other components within normal limits  TROPONIN T, HIGH SENSITIVITY  TROPONIN T, HIGH SENSITIVITY    EKG: EKG Interpretation Date/Time:  Sunday May 11 2024 14:42:49 EDT Ventricular Rate:  86 PR Interval:  148 QRS Duration:  79 QT Interval:  370 QTC Calculation: 443 R Axis:   60  Text  Interpretation: Sinus rhythm Confirmed by Ruthe Cornet [phone]) on 05/11/2024 2:50:08 PM  Radiology: No results found.    Medications Ordered in the ED  alum & mag hydroxide-simeth (MAALOX/MYLANTA) 200-200-20 MG/5ML suspension 30 mL (30 mLs Oral Given 05/11/24 1950)    And  lidocaine  (XYLOCAINE ) 2 % viscous mouth solution 15 mL (15 mLs Oral Given 05/11/24 1950)  magnesium  oxide (MAG-OX) tablet 400 mg (400 mg Oral Given 05/11/24 1950)  Medical Decision Making Amount and/or Complexity of Data Reviewed Labs: ordered. Radiology: ordered.  Risk OTC drugs. Prescription drug management.   Patient presents to the ED for concern of generalized weakness, fatigue, lightheadedness, palpitations, burping, this involves an extensive number of treatment options, and is a complaint that carries with it a high risk of complications and morbidity.  The differential diagnosis includes ACS, electrolyte abnormality, pneumonia, UTI, fluid overload, thyroid emergency, CVA/TIA, arrhythmia, migraine, hypoglycemia, hypoglycemia.  Not exhaustive list   Co morbidities that complicate the patient evaluation  See HPI   Additional history obtained:  Additional history obtained from [facility], Nursing, and Outside Medical Records   External records from outside source obtained and reviewed including triage RN note, family bedside, ED note from 04/13/2024 for similar complaints of lightheadedness   Lab Tests:  I Ordered, and personally interpreted labs.  The pertinent results include:   Magnesium  1.6 CBG 162 Anion gap 17 (was 17 five years ago) Hgb 11.4 UA without infection nor hemoglobin   Imaging Studies ordered:  I ordered imaging studies including chest x-ray I independently visualized and interpreted imaging which showed no acute cardiopulmonary pathology I agree with the radiologist interpretation   Cardiac Monitoring:  The patient was maintained on a  cardiac monitor.  I personally viewed and interpreted the cardiac monitored which showed an underlying rhythm of: SR at 86 bpm with no ST nor T wave abnormalities   Medicines ordered and prescription drug management:  I ordered medication including Maalox, meclizine , magnesium  for burping, lightheadedness, mild hypomagnesia Reevaluation of the patient after these medicines showed that the patient improved I have reviewed the patients home medicines and have made adjustments as needed     Problem List / ED Course:  Burping No abdominal tenderness nor complaints of constipation She recently weaned off of omeprazole for the past 2 weeks as there is concern regarding history of hyponatremia and hypomagnesia.  She has only been on Pepcid 40 mg twice daily.  Reports that she takes Alka-Seltzer which really improves symptoms. Provided GI cocktail which improved burping significantly Discussed that patient can follow-up with PCP for better management of GERD and/or possible referral to GI Hypomagnesia Mild but did provide p.o. supplementation Neuro intact Generalized weakness Fatigue Lightheadedness Neurologically intact with no dizziness, motor nor sensory deficits.  No focal deficits.  Able to ambulate without difficulty.  Low suspicion for CVA/TIA Hemodynamically stable Appears that she was seen 1 month ago at another ER for similar complaints of lightheadedness with reassuring workup Did not provide fluids as she is fluid restricted secondary to history of hypomagnesemia and hyponatremia Cardiac workup is reassuring.  Troponin x 2 WNL.  EKG without electrical abnormalities No obvious signs of infection.  Chest x-ray without pneumonia.  UA without infection Had her ambulate and she did so without difficulty nor limp nor weakness.  She may benefit from a cane as she occasionally has a knee jerk that causes her to feel off balance.  I discussed this with her but she does not want a cane at  this time Lightheadedness appears positional in nature.  She does not have lightheadedness at rest.  She reports that lightheadedness occurs when she has position changes but resolves following writing herself.  We will provide meclizine  prescription and see how she does with PCP follow-up Strict return precautions to include symptoms of stroke, posterior stroke Palpitations Not painful nor exertional.  Sounds chronic in nature as she reports that her PCP stated that she has an  extra beat for a long time per patient However cardiac workup is reassuring.   No desats with ambulation nor complaints of cp, shob Low heart score will have her follow-up with PCP   Reevaluation:  After the interventions noted above, I reevaluated the patient and found that they have :improved   Social Determinants of Health:  Has PCP f/u in [place]   Dispostion:  After consideration of the diagnostic results and the patients response to treatment, I feel that the patent would benefit from outpatient management PCP follow-up. Stable for DC  Discussed ED workup, disposition, return to ED precautions with patient who expresses understanding agrees with plan.  All questions answered to their satisfaction.  They are agreeable to plan.  Discharge instructions provided on paperwork  Final diagnoses:  Palpitations  Generalized weakness  Intermittent lightheadedness  Burping    ED Discharge Orders          Ordered    meclizine  (ANTIVERT ) 12.5 MG tablet  3 times daily PRN        05/11/24 2143               Minnie Tinnie BRAVO, PA 05/13/24 1548    Dreama Longs, MD 05/15/24 1007

## 2024-05-11 NOTE — ED Notes (Signed)
 Walked patient about 30 feet. Patient's oxygen lowered to 94% during the walk.
